# Patient Record
Sex: Male | Born: 2000 | Race: White | Hispanic: No | Marital: Single | Smoking: Never smoker
Health system: Southern US, Community
[De-identification: ages and names within clinical notes are randomized; demographics above are authoritative.]

---

## 2019-11-19 ENCOUNTER — Other Ambulatory Visit: Payer: Self-pay

## 2019-11-19 ENCOUNTER — Encounter: Payer: Self-pay | Admitting: Emergency Medicine

## 2019-11-19 ENCOUNTER — Ambulatory Visit
Admission: EM | Admit: 2019-11-19 | Discharge: 2019-11-19 | Disposition: A | Discharge status: Home / Self Care | Payer: Medicaid Other | Attending: Emergency Medicine | Admitting: Emergency Medicine

## 2019-11-19 DIAGNOSIS — R519 Headache, unspecified: Secondary | ICD-10-CM | POA: Diagnosis not present

## 2019-11-19 DIAGNOSIS — Z20828 Contact with and (suspected) exposure to other viral communicable diseases: Secondary | ICD-10-CM | POA: Diagnosis not present

## 2019-11-19 MED ORDER — NAPROXEN 500 MG PO TABS: 500.0000 mg | ORAL_TABLET | Freq: Two times a day (BID) | ORAL | 0 refills | Status: DC

## 2019-11-19 NOTE — ED Triage Notes (Signed)
Pt presents to [facility] for assessment for 2 hours of headache.  Mom is concerned for COVID, and patient wants to be tested.  Denies any other symptoms.

## 2019-11-19 NOTE — ED Notes (Signed)
Patient able to ambulate independently  

## 2019-11-19 NOTE — ED Provider Notes (Signed)
[facility]    CSN: [id number] Arrival date & time: 11/19/19  1951      History   Chief Complaint Chief Complaint  Patient presents with  . Headache    HPI Theodore Pierce is a 18 y.o. male   Presenting for Covid testing: Exposure: Coworker  Date of exposure: Tuesday: States coworker returned from [employer] that day Any fever, symptoms since exposure: Generalized headache without nausea, photophobia, phonophobia.  Patient tried Tylenol PTA without relief.   History reviewed. No pertinent past medical history.  There are no problems to display for this patient.   History reviewed. No pertinent surgical history.     Home Medications    Prior to Admission medications   Medication Sig Start Date End Date Taking? Authorizing Provider  naproxen (NAPROSYN) 500 MG tablet Take 1 tablet (500 mg total) by mouth 2 (two) times daily. 11/19/19   Hall-Potvin, [place], PA-C    Family History Family History  Problem Relation Age of Onset  . Diabetes Mother   . Healthy Father     Social History Social History   Tobacco Use  . Smoking status: Never Smoker  . Smokeless tobacco: Never Used  Substance Use Topics  . Alcohol use: Yes  . Drug use: Never     Allergies   Patient has no known allergies.   Review of Systems Review of Systems  Constitutional: Negative for fatigue and fever.  Respiratory: Negative for cough and shortness of breath.   Cardiovascular: Negative for chest pain and palpitations.  Gastrointestinal: Negative for abdominal pain, diarrhea and vomiting.  Musculoskeletal: Negative for arthralgias and myalgias.  Skin: Negative for rash and wound.  Neurological: Negative for speech difficulty and headaches.  All other systems reviewed and are negative.    Physical Exam Triage Vital Signs ED Triage Vitals  Enc Vitals Group     BP      Pulse      Resp      Temp      Temp src      SpO2      Weight      Height    Head Circumference      Peak Flow      Pain Score      Pain Loc      Pain Edu?      Excl. in GC?    No data found.  Updated Vital Signs BP 128/89 (BP Location: Left Arm)   Pulse 98   Temp 98.2 F (36.8 C) (Oral)   Resp 18   SpO2 98%   Visual Acuity Right Eye Distance:   Left Eye Distance:   Bilateral Distance:    Right Eye Near:   Left Eye Near:    Bilateral Near:     Physical Exam Constitutional:      General: He is not in acute distress.    Appearance: He is well-developed.  HENT:     Head: Normocephalic and atraumatic.     Mouth/Throat:     Mouth: Mucous membranes are moist.     Pharynx: Oropharynx is clear.  Eyes:     General: No scleral icterus.    Extraocular Movements: Extraocular movements intact.     Right eye: Normal extraocular motion and no nystagmus.     Left eye: Normal extraocular motion and no nystagmus.     Pupils: Pupils are equal, round, and reactive to light.  Cardiovascular:     Rate and Rhythm: Normal rate.  Pulmonary:     Effort: Pulmonary effort is normal. No respiratory distress.     Breath sounds: No wheezing.  Musculoskeletal:        General: No swelling. Normal range of motion.     Cervical back: Normal range of motion and neck supple. No rigidity.  Skin:    Capillary Refill: Capillary refill takes less than 2 seconds.     Coloration: Skin is not cyanotic, jaundiced or pale.     Findings: No rash.  Neurological:     Mental Status: He is alert and oriented to person, place, and time.     Cranial Nerves: No cranial nerve deficit, dysarthria or facial asymmetry.     Sensory: No sensory deficit.     Motor: No weakness.     Coordination: Coordination normal.     Gait: Gait normal.     Deep Tendon Reflexes: Reflexes normal.  Psychiatric:        Mood and Affect: Mood normal.        Speech: Speech normal.        Behavior: Behavior normal.      UC Treatments / Results  Labs (all labs ordered are listed, but only abnormal  results are displayed) Labs Reviewed  NOVEL CORONAVIRUS, NAA    EKG   Radiology No results found.  Procedures Procedures (including critical care time)  Medications Ordered in UC Medications - No data to display  Initial Impression / Assessment and Plan / UC Course  I have reviewed the triage vital signs and the nursing notes.  Pertinent labs & imaging results that were available during my care of the patient were reviewed by me and considered in my medical decision making (see chart for details).     Patient afebrile, nontoxic, with SpO2 98%.  No neurocognitive deficits on exam.  Covid PCR pending.  Patient to quarantine until results are back.  We will continue supportive management.  Try naproxen for headache.  Return precautions discussed, patient verbalized understanding and is agreeable to plan. Final Clinical Impressions(s) / UC Diagnoses   Final diagnoses:  Acute nonintractable headache, unspecified headache type     Discharge Instructions     Your COVID test is pending - it is important to quarantine / isolate at home until your results are back. If you test positive and would like further evaluation for persistent or worsening symptoms, you may schedule an E-visit or virtual (video) visit throughout the [facility] app or website.  PLEASE NOTE: If you develop severe chest pain or shortness of breath please go to the ER or call 9-1-1 for further evaluation --> DO NOT schedule electronic or virtual visits for this. Please call our office for further guidance / recommendations as needed.    ED Prescriptions    Medication Sig Dispense Auth. Provider   naproxen (NAPROSYN) 500 MG tablet Take 1 tablet (500 mg total) by mouth 2 (two) times daily. 30 tablet Hall-Potvin, [place], PA-C     PDMP not reviewed this encounter.   Hall-Potvin, [place], [place] 11/19/19 2025

## 2019-11-19 NOTE — Discharge Instructions (Addendum)
Your COVID test is pending - it is important to quarantine / isolate at home until your results are back. °If you test positive and would like further evaluation for persistent or worsening symptoms, you may schedule an E-visit or virtual (video) visit throughout the [facility] MyChart app or website. ° °PLEASE NOTE: If you develop severe chest pain or shortness of breath please go to the ER or call 9-1-1 for further evaluation --> DO NOT schedule electronic or virtual visits for this. °Please call our office for further guidance / recommendations as needed. °

## 2019-11-21 LAB — NOVEL CORONAVIRUS, NAA: SARS-CoV-2, NAA: NOT DETECTED

## 2019-12-02 ENCOUNTER — Encounter: Payer: Self-pay | Admitting: Emergency Medicine

## 2019-12-02 ENCOUNTER — Other Ambulatory Visit: Payer: Self-pay

## 2019-12-02 ENCOUNTER — Ambulatory Visit
Admission: EM | Admit: 2019-12-02 | Discharge: 2019-12-02 | Disposition: A | Discharge status: Home / Self Care | Payer: Medicaid Other | Attending: Emergency Medicine | Admitting: Emergency Medicine

## 2019-12-02 DIAGNOSIS — U071 COVID-19: Secondary | ICD-10-CM | POA: Diagnosis not present

## 2019-12-02 DIAGNOSIS — R509 Fever, unspecified: Secondary | ICD-10-CM

## 2019-12-02 LAB — POC SARS CORONAVIRUS 2 AG -  ED: SARS Coronavirus 2 Ag: POSITIVE — AB

## 2019-12-02 NOTE — Discharge Instructions (Signed)
It is very important to remember that since you have tested positive for Covid you need to be staying home and quarantining to help keep others safe and healthy in the community. ° °If you would like further evaluation for persistent or worsening symptoms, you may schedule an E-visit or virtual (video) visit throughout the [facility] MyChart app or website. ° °PLEASE NOTE: If you develop severe chest pain or shortness of breath please go to the ER or call 9-1-1 for further evaluation --> DO NOT schedule electronic or virtual visits for this. °Please call our office for further guidance / recommendations as needed. °

## 2019-12-02 NOTE — ED Provider Notes (Signed)
[facility]    CSN: [id number] Arrival date & time: 12/02/19  1258      History   Chief Complaint Chief Complaint  Patient presents with  . Fever    HPI Theodore Pierce is a 18 y.o. male presenting for Covid testing due to headache and fever of 101.4F this morning.  Patient states fever was alleviated with single dose of ibuprofen at 11 AM.  Patient denies cough, shortness of breath.  Of note, patient was seen by this provider on 12/10 for Covid testing with similar symptoms after work exposure 2 days before symptom onset: PCR test negative at that time.  Patient has not had symptoms in the interim.   History reviewed. No pertinent past medical history.  There are no problems to display for this patient.   History reviewed. No pertinent surgical history.     Home Medications    Prior to Admission medications   Not on File    Family History Family History  Problem Relation Age of Onset  . Diabetes Mother   . Healthy Father     Social History Social History   Tobacco Use  . Smoking status: Never Smoker  . Smokeless tobacco: Never Used  Substance Use Topics  . Alcohol use: Yes  . Drug use: Never     Allergies   Patient has no known allergies.   Review of Systems Review of Systems  Constitutional: Positive for fever. Negative for activity change, appetite change and fatigue.  Respiratory: Negative for cough and shortness of breath.   Cardiovascular: Negative for chest pain and palpitations.  Gastrointestinal: Negative for abdominal pain, diarrhea, nausea and vomiting.  Musculoskeletal: Negative for arthralgias and myalgias.  Skin: Negative for rash and wound.  Neurological: Positive for headaches. Negative for dizziness, facial asymmetry, speech difficulty and light-headedness.  All other systems reviewed and are negative.    Physical Exam Triage Vital Signs ED Triage Vitals  Enc Vitals Group     BP 12/02/19 1311 113/78   Pulse Rate 12/02/19 1311 (!) 125     Resp 12/02/19 1311 16     Temp 12/02/19 1311 99.2 F (37.3 C)     Temp Source 12/02/19 1311 Temporal     SpO2 12/02/19 1311 95 %     Weight --      Height --      Head Circumference --      Peak Flow --      Pain Score 12/02/19 1312 2     Pain Loc --      Pain Edu? --      Excl. in GC? --    No data found.  Updated Vital Signs BP 113/78 (BP Location: Left Arm)   Pulse (!) 125   Temp 99.2 F (37.3 C) (Temporal)   Resp 16   SpO2 95%   Visual Acuity Right Eye Distance:   Left Eye Distance:   Bilateral Distance:    Right Eye Near:   Left Eye Near:    Bilateral Near:     Physical Exam Constitutional:      General: He is not in acute distress.    Appearance: He is normal weight. He is not toxic-appearing.  HENT:     Head: Normocephalic and atraumatic.     Mouth/Throat:     Mouth: Mucous membranes are moist.     Pharynx: Oropharynx is clear.  Eyes:     General: No scleral icterus.    Pupils: Pupils  are equal, round, and reactive to light.  Cardiovascular:     Rate and Rhythm: Normal rate.     Comments: HR 109-114bpm at bedside Pulmonary:     Effort: Pulmonary effort is normal. No respiratory distress.     Breath sounds: No wheezing.  Skin:    Coloration: Skin is not jaundiced or pale.  Neurological:     Mental Status: He is alert and oriented to person, place, and time.      UC Treatments / Results  Labs (all labs ordered are listed, but only abnormal results are displayed) Labs Reviewed  POC SARS CORONAVIRUS 2 AG -  ED - Abnormal; Notable for the following components:      Result Value   SARS Coronavirus 2 Ag Positive (*)    All other components within normal limits    EKG   Radiology No results found.  Procedures Procedures (including critical care time)  Medications Ordered in UC Medications - No data to display  Initial Impression / Assessment and Plan / UC Course  I have reviewed the triage vital  signs and the nursing notes.  Pertinent labs & imaging results that were available during my care of the patient were reviewed by me and considered in my medical decision making (see chart for details).     Rapid Covid done in office given patient's fever, tachycardia: Positive.  Patient denying chest pain, appears well-hydrated: EKG deferred at this time.  Patient to begin quarantine.  We will continue supportive management.  Return precautions discussed, patient verbalized understanding and is agreeable to plan. Final Clinical Impressions(s) / UC Diagnoses   Final diagnoses:  Fever, unspecified  COVID-19 virus infection     Discharge Instructions     It is very important to remember that since you have tested positive for Covid you need to be staying home and quarantining to help keep others safe and healthy in the community.  If you would like further evaluation for persistent or worsening symptoms, you may schedule an E-visit or virtual (video) visit throughout the [facility] app or website.  PLEASE NOTE: If you develop severe chest pain or shortness of breath please go to the ER or call 9-1-1 for further evaluation --> DO NOT schedule electronic or virtual visits for this. Please call our office for further guidance / recommendations as needed.    ED Prescriptions    None     PDMP not reviewed this encounter.   Hall-Potvin, [place], [place] 12/02/19 1413

## 2019-12-02 NOTE — ED Triage Notes (Signed)
Pt presents to [facility] fro assessment after waking up this morning with headache and fever of 101.3.  Took ibuprofen around 11am.

## 2020-10-06 ENCOUNTER — Ambulatory Visit
Admission: EM | Admit: 2020-10-06 | Discharge: 2020-10-06 | Disposition: A | Discharge status: Home / Self Care | Payer: Medicaid Other | Attending: Emergency Medicine | Admitting: Emergency Medicine

## 2020-10-06 ENCOUNTER — Other Ambulatory Visit: Payer: Self-pay

## 2020-10-06 DIAGNOSIS — S29019A Strain of muscle and tendon of unspecified wall of thorax, initial encounter: Secondary | ICD-10-CM | POA: Diagnosis not present

## 2020-10-06 MED ORDER — CYCLOBENZAPRINE HCL 5 MG PO TABS: 5.0000 mg | ORAL_TABLET | Freq: Two times a day (BID) | ORAL | 0 refills | Status: AC | PRN

## 2020-10-06 MED ORDER — NAPROXEN 500 MG PO TABS: 500.0000 mg | ORAL_TABLET | Freq: Two times a day (BID) | ORAL | 0 refills | Status: DC

## 2020-10-06 NOTE — Discharge Instructions (Addendum)

## 2020-10-06 NOTE — ED Triage Notes (Signed)
Pt c/o rt mid back pain x2 days after trying to throw a bag of ice across his shoulder. States pain worse on movement.

## 2020-10-06 NOTE — ED Provider Notes (Signed)
[facility]    CSN: [id number] Arrival date & time: 10/06/20  1101      History   Chief Complaint Chief Complaint  Patient presents with  . Back Pain    HPI Theodore Pierce is a 19 y.o. male  presents for right mid back pain x 2 days.  States pain is nonradiating, worse w/ lifting, positional.  Has tried nothing for relief.  Denies trauma/injury to the affected area, though states this occurred after "throwing ice over my shoulder".  Denies fever, saddle area anesthesia, lower extremity numbness/weakness, urinary retention, fecal incontinence.  History reviewed. No pertinent past medical history.  There are no problems to display for this patient.   History reviewed. No pertinent surgical history.     Home Medications    Prior to Admission medications   Medication Sig Start Date End Date Taking? Authorizing Provider  cyclobenzaprine (FLEXERIL) 5 MG tablet Take 1 tablet (5 mg total) by mouth 2 (two) times daily as needed for up to 7 days for muscle spasms. 10/06/20 10/13/20  Hall-Potvin, [place], PA-C  naproxen (NAPROSYN) 500 MG tablet Take 1 tablet (500 mg total) by mouth 2 (two) times daily. 10/06/20   Hall-Potvin, [place], PA-C    Family History Family History  Problem Relation Age of Onset  . Diabetes Mother   . Healthy Father     Social History Social History   Tobacco Use  . Smoking status: Never Smoker  . Smokeless tobacco: Never Used  Substance Use Topics  . Alcohol use: Yes  . Drug use: Never     Allergies   Patient has no known allergies.   Review of Systems Review of Systems  Constitutional: Negative for fatigue and fever.  Respiratory: Negative for cough and shortness of breath.   Cardiovascular: Negative for chest pain and palpitations.  Gastrointestinal: Negative for abdominal pain, diarrhea and vomiting.  Musculoskeletal: Positive for back pain. Negative for arthralgias, gait problem, myalgias, neck pain and neck  stiffness.  Skin: Negative for rash and wound.  Neurological: Negative for speech difficulty, weakness and headaches.  All other systems reviewed and are negative.    Physical Exam Triage Vital Signs ED Triage Vitals [10/06/20 1116]  Enc Vitals Group     BP 135/84     Pulse Rate 98     Resp 18     Temp 98.4 F (36.9 C)     Temp Source Oral     SpO2 96 %     Weight      Height      Head Circumference      Peak Flow      Pain Score 8     Pain Loc      Pain Edu?      Excl. in GC?    No data found.  Updated Vital Signs BP 135/84 (BP Location: Left Arm)   Pulse 98   Temp 98.4 F (36.9 C) (Oral)   Resp 18   SpO2 96%   Visual Acuity Right Eye Distance:   Left Eye Distance:   Bilateral Distance:    Right Eye Near:   Left Eye Near:    Bilateral Near:     Physical Exam Constitutional:      General: He is not in acute distress. HENT:     Head: Normocephalic and atraumatic.  Eyes:     General: No scleral icterus.    Pupils: Pupils are equal, round, and reactive to light.  Cardiovascular:  Rate and Rhythm: Normal rate.  Pulmonary:     Effort: Pulmonary effort is normal. No respiratory distress.     Breath sounds: No wheezing.  Musculoskeletal:        General: Tenderness present. No swelling. Normal range of motion.       Arms:     Cervical back: Normal range of motion and neck supple. No rigidity or tenderness.  Skin:    Capillary Refill: Capillary refill takes less than 2 seconds.     Coloration: Skin is not jaundiced or pale.  Neurological:     General: No focal deficit present.     Mental Status: He is alert and oriented to person, place, and time.      UC Treatments / Results  Labs (all labs ordered are listed, but only abnormal results are displayed) Labs Reviewed - No data to display  EKG   Radiology No results found.  Procedures Procedures (including critical care time)  Medications Ordered in UC Medications - No data to  display  Initial Impression / Assessment and Plan / UC Course  I have reviewed the triage vital signs and the nursing notes.  Pertinent labs & imaging results that were available during my care of the patient were reviewed by me and considered in my medical decision making (see chart for details).     Will treat for likely strain as below.  Return precautions discussed, pt verbalized understanding and is agreeable to plan. Final Clinical Impressions(s) / UC Diagnoses   Final diagnoses:  Strain of thoracic region, initial encounter     Discharge Instructions     Heat therapy (hot compress, warm wash rag, hot showers, etc.) can help relax muscles and soothe muscle aches. Cold therapy (ice packs) can be used to help swelling both after injury and after prolonged use of areas of chronic pain/aches.  Pain medication:  500 mg Naprosyn/Aleve (naproxen) every 12 hours with food:  AVOID other NSAIDs while taking this (may have Tylenol).  May take muscle relaxer as needed for severe pain / spasm.  (This medication may cause you to become tired so it is important you do not drink alcohol or operate heavy machinery while on this medication.  Recommend your first dose to be taken before bedtime to monitor for side effects safely)  Important to follow up with specialist(s) below for further evaluation/management if your symptoms persist or worsen.    ED Prescriptions    Medication Sig Dispense Auth. Provider   naproxen (NAPROSYN) 500 MG tablet Take 1 tablet (500 mg total) by mouth 2 (two) times daily. 30 tablet Hall-Potvin, [place], PA-C   cyclobenzaprine (FLEXERIL) 5 MG tablet Take 1 tablet (5 mg total) by mouth 2 (two) times daily as needed for up to 7 days for muscle spasms. 14 tablet Hall-Potvin, [place], PA-C     I have reviewed the PDMP during this encounter.   Hall-Potvin, [place], [place] 10/06/20 1230

## 2021-04-05 ENCOUNTER — Ambulatory Visit
Admission: EM | Admit: 2021-04-05 | Discharge: 2021-04-05 | Disposition: A | Discharge status: Home / Self Care | Payer: Medicaid Other | Attending: Internal Medicine | Admitting: Internal Medicine

## 2021-04-05 ENCOUNTER — Encounter: Payer: Self-pay | Admitting: Emergency Medicine

## 2021-04-05 ENCOUNTER — Other Ambulatory Visit: Payer: Self-pay

## 2021-04-05 ENCOUNTER — Ambulatory Visit (INDEPENDENT_AMBULATORY_CARE_PROVIDER_SITE_OTHER): Payer: Medicaid Other

## 2021-04-05 DIAGNOSIS — R509 Fever, unspecified: Secondary | ICD-10-CM

## 2021-04-05 DIAGNOSIS — B349 Viral infection, unspecified: Secondary | ICD-10-CM | POA: Diagnosis not present

## 2021-04-05 DIAGNOSIS — R059 Cough, unspecified: Secondary | ICD-10-CM | POA: Diagnosis not present

## 2021-04-05 MED ORDER — ACETAMINOPHEN 325 MG PO TABS
650.0000 mg | ORAL_TABLET | Freq: Once | ORAL | Status: AC
  Administered 2021-04-05: 650 mg via ORAL

## 2021-04-05 MED ORDER — BENZONATATE 100 MG PO CAPS: 100.0000 mg | ORAL_CAPSULE | Freq: Three times a day (TID) | ORAL | 0 refills | Status: AC

## 2021-04-05 MED ORDER — SODIUM CHLORIDE 0.9 % IV BOLUS: 1000.0000 mL | Freq: Once | INTRAVENOUS | Status: DC

## 2021-04-05 NOTE — Discharge Instructions (Addendum)
Please go to the ED if your symptoms worsen. If you experience worsening shortness of breath, chest or chest pressure, feeling faint-please go to the emergency department to be evaluated further. We will call you with lab results and abnormal Increase oral fluid intake

## 2021-04-05 NOTE — ED Triage Notes (Signed)
Pt here for fever and body aches with cough starting last night

## 2021-04-05 NOTE — ED Notes (Signed)
Pt declined all blood work and fluids; PL notified

## 2021-04-05 NOTE — ED Provider Notes (Signed)
[facility]    CSN: [id number] Arrival date & time: 04/05/21  1207      History   Chief Complaint Chief Complaint  Patient presents with  . Fever  . Cough    HPI Theodore Pierce is a 20 y.o. male comes to the urgent care with complaints of a fever of 101.2 Fahrenheit, generalized body aches, cough and some sore throat which started last night.  Patient's symptom onset was sudden and has been persistent.  Cough is productive of greenish sputum.  He denies any shortness of breath or wheezing.  No sick exposures.  Patient has not been vaccinated against COVID-19 virus or influenza.  No nausea, vomiting or diarrhea.  No abdominal pain.  Patient has some dizziness but denies any syncopal or near syncopal episodes. HPI  History reviewed. No pertinent past medical history.  There are no problems to display for this patient.   History reviewed. No pertinent surgical history.     Home Medications    Prior to Admission medications   Medication Sig Start Date End Date Taking? Authorizing Provider  benzonatate (TESSALON) 100 MG capsule Take 1 capsule (100 mg total) by mouth every 8 (eight) hours. 04/05/21  Yes Braylon Grenda, Britta Mccreedy, MD  naproxen (NAPROSYN) 500 MG tablet Take 1 tablet (500 mg total) by mouth 2 (two) times daily. 10/06/20   Hall-Potvin, [place], PA-C    Family History Family History  Problem Relation Age of Onset  . Diabetes Mother   . Healthy Father     Social History Social History   Tobacco Use  . Smoking status: Never Smoker  . Smokeless tobacco: Never Used  Substance Use Topics  . Alcohol use: Yes  . Drug use: Never     Allergies   Patient has no known allergies.   Review of Systems Review of Systems  Constitutional: Positive for chills and fever. Negative for activity change.  HENT: Positive for congestion.   Eyes: Negative.   Respiratory: Positive for cough and shortness of breath. Negative for wheezing.   Cardiovascular:  Negative.  Negative for chest pain.  Gastrointestinal: Negative.   Neurological: Positive for dizziness and light-headedness. Negative for headaches.     Physical Exam Triage Vital Signs ED Triage Vitals  Enc Vitals Group     BP 04/05/21 1414 (!) 104/58     Pulse Rate 04/05/21 1414 (!) 122     Resp 04/05/21 1414 18     Temp 04/05/21 1414 (!) 101.2 F (38.4 C)     Temp Source 04/05/21 1414 Oral     SpO2 04/05/21 1414 96 %     Weight --      Height --      Head Circumference --      Peak Flow --      Pain Score 04/05/21 1415 7     Pain Loc --      Pain Edu? --      Excl. in GC? --    No data found.  Updated Vital Signs BP (!) 104/58 (BP Location: Left Arm)   Pulse (!) 122   Temp (!) 101.2 F (38.4 C) (Oral)   Resp 18   SpO2 96%   Visual Acuity Right Eye Distance:   Left Eye Distance:   Bilateral Distance:    Right Eye Near:   Left Eye Near:    Bilateral Near:     Physical Exam Vitals and nursing note reviewed.  Constitutional:  Appearance: Normal appearance. He is ill-appearing.  HENT:     Right Ear: Tympanic membrane normal.     Left Ear: Tympanic membrane normal.     Mouth/Throat:     Mouth: Mucous membranes are moist.     Pharynx: No oropharyngeal exudate or posterior oropharyngeal erythema.  Eyes:     Extraocular Movements: Extraocular movements intact.     Conjunctiva/sclera: Conjunctivae normal.  Cardiovascular:     Rate and Rhythm: Normal rate and regular rhythm.     Pulses: Normal pulses.     Heart sounds: Normal heart sounds.  Pulmonary:     Effort: Pulmonary effort is normal. No respiratory distress.     Breath sounds: Normal breath sounds. No stridor. No wheezing or rhonchi.  Abdominal:     General: Bowel sounds are normal.     Palpations: Abdomen is soft.  Musculoskeletal:        General: Normal range of motion.  Neurological:     Mental Status: He is alert.      UC Treatments / Results  Labs (all labs ordered are listed, but  only abnormal results are displayed) Labs Reviewed  COVID-19, FLU A+B NAA  CBC WITH DIFFERENTIAL/PLATELET  COMPREHENSIVE METABOLIC PANEL    EKG   Radiology DG Chest 2 View  Result Date: 04/05/2021 CLINICAL DATA:  Cough and fever EXAM: CHEST - 2 VIEW COMPARISON:  None. FINDINGS: The heart size and mediastinal contours are within normal limits. Both lungs are clear. The visualized skeletal structures are unremarkable. IMPRESSION: No active cardiopulmonary disease. Electronically Signed   By: Maudry Mayhew MD   On: 04/05/2021 15:03    Procedures Procedures (including critical care time)  Medications Ordered in UC Medications  sodium chloride 0.9 % bolus 1,000 mL (has no administration in time range)  acetaminophen (TYLENOL) tablet 650 mg (650 mg Oral Given 04/05/21 1420)    Initial Impression / Assessment and Plan / UC Course  I have reviewed the triage vital signs and the nursing notes.  Pertinent labs & imaging results that were available during my care of the patient were reviewed by me and considered in my medical decision making (see chart for details).     1.  Acute viral syndrome: Chest x-ray is negative for acute lung infiltrate I offered normal saline bolus and blood work but the patient refused IV fluids and the blood draws.  The risks of not having further work-up and receiving IV fluids was discussed with the patient.  He is adamant about not getting any IV fluids or blood draws.Patient is advised to increase oral fluids.We advised patient to go to the emergency department if symptoms worsen.   Final Clinical Impressions(s) / UC Diagnoses   Final diagnoses:  Fever, unspecified  Acute viral syndrome     Discharge Instructions     Please go to the ED if your symptoms worsen. If you experience worsening shortness of breath, chest or chest pressure, feeling faint-please go to the emergency department to be evaluated further. We will call you with lab results and  abnormal Increase oral fluid intake   ED Prescriptions    Medication Sig Dispense Auth. Provider   benzonatate (TESSALON) 100 MG capsule Take 1 capsule (100 mg total) by mouth every 8 (eight) hours. 21 capsule Treysean Petruzzi, Britta Mccreedy, MD     PDMP not reviewed this encounter.   Merrilee Jansky, MD 04/05/21 [phone]

## 2021-04-06 LAB — COVID-19, FLU A+B NAA
Influenza A, NAA: NOT DETECTED
Influenza B, NAA: NOT DETECTED
SARS-CoV-2, NAA: NOT DETECTED

## 2021-04-15 ENCOUNTER — Encounter: Payer: Self-pay | Admitting: *Deleted

## 2021-04-15 ENCOUNTER — Ambulatory Visit
Admission: EM | Admit: 2021-04-15 | Discharge: 2021-04-15 | Disposition: A | Discharge status: Home / Self Care | Payer: Medicaid Other | Attending: Family Medicine | Admitting: Family Medicine

## 2021-04-15 ENCOUNTER — Other Ambulatory Visit: Payer: Self-pay

## 2021-04-15 DIAGNOSIS — R062 Wheezing: Secondary | ICD-10-CM

## 2021-04-15 DIAGNOSIS — R053 Chronic cough: Secondary | ICD-10-CM | POA: Diagnosis not present

## 2021-04-15 MED ORDER — AZITHROMYCIN 250 MG PO TABS: 250.0000 mg | ORAL_TABLET | Freq: Every day | ORAL | 0 refills | Status: AC

## 2021-04-15 MED ORDER — PREDNISONE 20 MG PO TABS: 40.0000 mg | ORAL_TABLET | Freq: Every day | ORAL | 0 refills | Status: AC

## 2021-04-15 MED ORDER — HYDROCOD POLST-CPM POLST ER 10-8 MG/5ML PO SUER: 5.0000 mL | Freq: Every evening | ORAL | 0 refills | Status: DC | PRN

## 2021-04-15 NOTE — ED Triage Notes (Signed)
Pt was seen 4/27 for fever and cough; states no longer has fevers and has been taking Rx cough med, but continues with significant cough and runny nose.

## 2021-04-15 NOTE — Discharge Instructions (Signed)
Be aware, your cough medication may cause drowsiness. Please do not drive, operate heavy machinery or make important decisions while on this medication, it can cloud your judgement.  

## 2021-04-15 NOTE — ED Provider Notes (Signed)
[facility]   [id number] 04/15/21 Arrival Time: 0808  ASSESSMENT & PLAN:  1. Persistent dry cough   2. Wheezing    No indication for re-imaging at this time. Work note provided.  Meds ordered this encounter  Medications  . azithromycin (ZITHROMAX) 250 MG tablet    Sig: Take 1 tablet (250 mg total) by mouth daily. Take first 2 tablets together, then 1 every day until finished.    Dispense:  6 tablet    Refill:  0  . predniSONE (DELTASONE) 20 MG tablet    Sig: Take 2 tablets (40 mg total) by mouth daily.    Dispense:  10 tablet    Refill:  0  . chlorpheniramine-HYDROcodone (TUSSIONEX PENNKINETIC ER) 10-8 MG/5ML SUER    Sig: Take 5 mLs by mouth at bedtime as needed for cough.    Dispense:  90 mL    Refill:  0    Discharge Instructions     Be aware, your cough medication may cause drowsiness. Please do not drive, operate heavy machinery or make important decisions while on this medication, it can cloud your judgement.  OTC symptom care as needed. Ensure adequate fluid intake and rest.   Follow-up Information    [facility] .   Specialty: Urgent Care Why: If worsening or failing to improve as anticipated. Contact information: [address] [id number] mc [place] [address] [phone]               Reviewed expectations re: course of current medical issues. Questions answered. Outlined signs and symptoms indicating need for more acute intervention. Patient verbalized understanding. After Visit Summary given.   SUBJECTIVE: History from: patient. Seen here 04/05/21; note reviewed by me. [place] comes into day reporting persistent, significant dry cough for the past 2.5, maybe 3 w. Prev Rx cough med doesn't help. I have personally viewed the imaging studies ordered last visit; normal chest. Feels overall better but cough is persistent and limiting sleep and limiting his performance at work. No  fevers or SOB. Questions wheezing at times. COVID and flu negative last visit. No specific aggravating or alleviating factors reported. Normal PO intake without n/v/d.  Social History   Tobacco Use  Smoking Status Never Smoker  Smokeless Tobacco Never Used      OBJECTIVE:  BP (!) 142/88 (BP Location: Left Arm)   Pulse 90   Temp 98.5 F (36.9 C) (Oral)   Resp 20   SpO2 97%   Recheck RR: 18 General appearance: alert; appears fatigued HEENT: nasal congestion; clear runny nose; throat irritation secondary to post-nasal drainage Neck: supple without LAD CV: RRR Lungs: unlabored respirations, symmetrical air entry with mild bilateral exp wheezing; cough: marked and dry Abd: soft Ext: no LE edema Skin: warm and dry Psychological: alert and cooperative; normal mood and affect No results found.  No Known Allergies  History reviewed. No pertinent past medical history.   Family History  Problem Relation Age of Onset  . Diabetes Mother    Social History   Socioeconomic History  . Marital status: Single    Spouse name: Not on file  . Number of children: Not on file  . Years of education: Not on file  . Highest education level: Not on file  Occupational History  . Not on file  Tobacco Use  . Smoking status: Never Smoker  . Smokeless tobacco: Never Used  Vaping Use  . Vaping Use: Never used  Substance  and Sexual Activity  . Alcohol use: Not Currently  . Drug use: Never  . Sexual activity: Not on file  Other Topics Concern  . Not on file  Social History Narrative  . Not on file   Social Determinants of Health   Financial Resource Strain: Not on file  Food Insecurity: Not on file  Transportation Needs: Not on file  Physical Activity: Not on file  Stress: Not on file  Social Connections: Not on file  Intimate Partner Violence: Not on file           Mardella Layman, MD 04/15/21 [phone]

## 2021-05-02 ENCOUNTER — Encounter (HOSPITAL_COMMUNITY): Payer: Self-pay | Admitting: Student

## 2021-05-02 ENCOUNTER — Emergency Department (HOSPITAL_COMMUNITY)
Admission: EM | Admit: 2021-05-02 | Discharge: 2021-05-02 | Disposition: A | Discharge status: Home / Self Care | Payer: Medicaid Other | Attending: Emergency Medicine | Admitting: Emergency Medicine

## 2021-05-02 ENCOUNTER — Emergency Department (HOSPITAL_COMMUNITY): Payer: Medicaid Other

## 2021-05-02 DIAGNOSIS — Z28310 Unvaccinated for covid-19: Secondary | ICD-10-CM | POA: Diagnosis not present

## 2021-05-02 DIAGNOSIS — R059 Cough, unspecified: Secondary | ICD-10-CM | POA: Diagnosis present

## 2021-05-02 DIAGNOSIS — R000 Tachycardia, unspecified: Secondary | ICD-10-CM | POA: Insufficient documentation

## 2021-05-02 DIAGNOSIS — U071 COVID-19: Secondary | ICD-10-CM | POA: Insufficient documentation

## 2021-05-02 DIAGNOSIS — D72829 Elevated white blood cell count, unspecified: Secondary | ICD-10-CM | POA: Insufficient documentation

## 2021-05-02 LAB — COMPREHENSIVE METABOLIC PANEL
ALT: 22 U/L (ref 0–44)
AST: 16 U/L (ref 15–41)
Albumin: 4.4 g/dL (ref 3.5–5.0)
Alkaline Phosphatase: 71 U/L (ref 38–126)
Anion gap: 9 (ref 5–15)
BUN: 7 mg/dL (ref 6–20)
CO2: 26 mmol/L (ref 22–32)
Calcium: 9.7 mg/dL (ref 8.9–10.3)
Chloride: 105 mmol/L (ref 98–111)
Creatinine, Ser: 0.98 mg/dL (ref 0.61–1.24)
GFR, Estimated: >60 mL/min (ref >60)
Glucose, Bld: 90 mg/dL (ref 70–99)
Potassium: 3.8 mmol/L (ref 3.5–5.1)
Sodium: 140 mmol/L (ref 135–145)
Total Bilirubin: 0.7 mg/dL (ref 0.3–1.2)
Total Protein: 8.2 g/dL — ABNORMAL HIGH (ref 6.5–8.1)

## 2021-05-02 LAB — CBC WITH DIFFERENTIAL/PLATELET
Abs Immature Granulocytes: 0.03 10*3/uL (ref 0.00–0.07)
Basophils Absolute: 0 10*3/uL (ref 0.0–0.1)
Basophils Relative: 0 %
Eosinophils Absolute: 0.1 10*3/uL (ref 0.0–0.5)
Eosinophils Relative: 1 %
HCT: 47.8 % (ref 39.0–52.0)
Hemoglobin: 16 g/dL (ref 13.0–17.0)
Immature Granulocytes: 0 %
Lymphocytes Relative: 10 %
Lymphs Abs: 1.5 10*3/uL (ref 0.7–4.0)
MCH: 30.1 pg (ref 26.0–34.0)
MCHC: 33.5 g/dL (ref 30.0–36.0)
MCV: 89.8 fL (ref 80.0–100.0)
Monocytes Absolute: 0.8 10*3/uL (ref 0.1–1.0)
Monocytes Relative: 5 %
Neutro Abs: 12.1 10*3/uL — ABNORMAL HIGH (ref 1.7–7.7)
Neutrophils Relative %: 84 %
Platelets: 298 10*3/uL (ref 150–400)
RBC: 5.32 MIL/uL (ref 4.22–5.81)
RDW: 12.6 % (ref 11.5–15.5)
WBC: 14.6 10*3/uL — ABNORMAL HIGH (ref 4.0–10.5)
nRBC: 0 % (ref 0.0–0.2)

## 2021-05-02 LAB — RESP PANEL BY RT-PCR (FLU A&B, COVID) ARPGX2
Influenza A by PCR: NEGATIVE
Influenza B by PCR: NEGATIVE
SARS Coronavirus 2 by RT PCR: POSITIVE — AB

## 2021-05-02 MED ORDER — ACETAMINOPHEN 325 MG PO TABS
650.0000 mg | ORAL_TABLET | Freq: Once | ORAL | Status: AC
  Administered 2021-05-02: 650 mg via ORAL
  Filled 2021-05-02: qty 2

## 2021-05-02 NOTE — Discharge Instructions (Addendum)
You have tested positive for COVID-19 which we suspect is the cause of your symptoms.   We are instructing patient's with COVID 19 or symptoms of COVID 19 to follow the below instructions regardless of vaccination status:  - Stay home and isolate for days 1 through 5, day 0 is your first day of symptom onset. - If you have no symptoms or your symptoms are resolving after 5 days, on day 6, you can leave your house--> Continue to wear a mask around others for 5 additional days. - If you have a fever, continue to stay home until your fever resolves.   Take Tylenol per over-the-counter dosing to help with fever/discomfort.  Please be sure to stay well-hydrated.  Drink plenty of electrolyte containing solutions.  Please follow up with primary care within 3-5 days for re-evaluation- call prior to going to the office to make them aware of your symptoms as some offices are altering their method of seeing patients with COVID 19 symptoms, we have also provided our [place] covid clinic for follow up as well.  Return to the ER for new or worsening symptoms including but not limited to increased work of breathing, chest pain, passing out, or any other concerns.       Person Under Monitoring Name: Theodore Pierce  Location: [address] [place] [address]   Infection Prevention Recommendations for Individuals Confirmed to have, or Being Evaluated for, 2019 Novel Coronavirus (COVID-19) Infection Who Receive Care at Home  Individuals who are confirmed to have, or are being evaluated for, COVID-19 should follow the prevention steps below until a healthcare provider or local or state health department says they can return to normal activities.  Stay home except to get medical care You should restrict activities outside your home, except for getting medical care. Do not go to work, school, or public areas, and do not use public transportation or taxis.  Call ahead before visiting  your doctor Before your medical appointment, call the healthcare provider and tell them that you have, or are being evaluated for, COVID-19 infection. This will help the healthcare provider's office take steps to keep other people from getting infected. Ask your healthcare provider to call the local or state health department.  Monitor your symptoms Seek prompt medical attention if your illness is worsening (e.g., difficulty breathing). Before going to your medical appointment, call the healthcare provider and tell them that you have, or are being evaluated for, COVID-19 infection. Ask your healthcare provider to call the local or state health department.  Wear a facemask You should wear a facemask that covers your nose and mouth when you are in the same room with other people and when you visit a healthcare provider. People who live with or visit you should also wear a facemask while they are in the same room with you.  Separate yourself from other people in your home As much as possible, you should stay in a different room from other people in your home. Also, you should use a separate bathroom, if available.  Avoid sharing household items You should not share dishes, drinking glasses, cups, eating utensils, towels, bedding, or other items with other people in your home. After using these items, you should wash them thoroughly with soap and water.  Cover your coughs and sneezes Cover your mouth and nose with a tissue when you cough or sneeze, or you can cough or sneeze into your sleeve. Throw used tissues in a lined  trash can, and immediately wash your hands with soap and water for at least 20 seconds or use an alcohol-based hand rub.  Wash your [employer] your hands often and thoroughly with soap and water for at least 20 seconds. You can use an alcohol-based hand sanitizer if soap and water are not available and if your hands are not visibly dirty. Avoid touching your eyes, nose,  and mouth with unwashed hands.   Prevention Steps for Caregivers and Household Members of Individuals Confirmed to have, or Being Evaluated for, COVID-19 Infection Being Cared for in the Home  If you live with, or provide care at home for, a person confirmed to have, or being evaluated for, COVID-19 infection please follow these guidelines to prevent infection:  Follow healthcare provider's instructions Make sure that you understand and can help the patient follow any healthcare provider instructions for all care.  Provide for the patient's basic needs You should help the patient with basic needs in the home and provide support for getting groceries, prescriptions, and other personal needs.  Monitor the patient's symptoms If they are getting sicker, call his or her medical provider and tell them that the patient has, or is being evaluated for, COVID-19 infection. This will help the healthcare provider's office take steps to keep other people from getting infected. Ask the healthcare provider to call the local or state health department.  Limit the number of people who have contact with the patient If possible, have only one caregiver for the patient. Other household members should stay in another home or place of residence. If this is not possible, they should stay in another room, or be separated from the patient as much as possible. Use a separate bathroom, if available. Restrict visitors who do not have an essential need to be in the home.  Keep older adults, very young children, and other sick people away from the patient Keep older adults, very young children, and those who have compromised immune systems or chronic health conditions away from the patient. This includes people with chronic heart, lung, or kidney conditions, diabetes, and cancer.  Ensure good ventilation Make sure that shared spaces in the home have good air flow, such as from an air conditioner or an opened  window, weather permitting.  Wash your hands often Wash your hands often and thoroughly with soap and water for at least 20 seconds. You can use an alcohol based hand sanitizer if soap and water are not available and if your hands are not visibly dirty. Avoid touching your eyes, nose, and mouth with unwashed hands. Use disposable paper towels to dry your hands. If not available, use dedicated cloth towels and replace them when they become wet.  Wear a facemask and gloves Wear a disposable facemask at all times in the room and gloves when you touch or have contact with the patient's blood, body fluids, and/or secretions or excretions, such as sweat, saliva, sputum, nasal mucus, vomit, urine, or feces.  Ensure the mask fits over your nose and mouth tightly, and do not touch it during use. Throw out disposable facemasks and gloves after using them. Do not reuse. Wash your hands immediately after removing your facemask and gloves. If your personal clothing becomes contaminated, carefully remove clothing and launder. Wash your hands after handling contaminated clothing. Place all used disposable facemasks, gloves, and other waste in a lined container before disposing them with other household waste. Remove gloves and wash your hands immediately after handling these items.  Do not share dishes, glasses, or other household items with the patient Avoid sharing household items. You should not share dishes, drinking glasses, cups, eating utensils, towels, bedding, or other items with a patient who is confirmed to have, or being evaluated for, COVID-19 infection. After the person uses these items, you should wash them thoroughly with soap and water.  Wash laundry thoroughly Immediately remove and wash clothes or bedding that have blood, body fluids, and/or secretions or excretions, such as sweat, saliva, sputum, nasal mucus, vomit, urine, or feces, on them. Wear gloves when handling laundry from the  patient. Read and follow directions on labels of laundry or clothing items and detergent. In general, wash and dry with the warmest temperatures recommended on the label.  Clean all areas the individual has used often Clean all touchable surfaces, such as counters, tabletops, doorknobs, bathroom fixtures, toilets, phones, keyboards, tablets, and bedside tables, every day. Also, clean any surfaces that may have blood, body fluids, and/or secretions or excretions on them. Wear gloves when cleaning surfaces the patient has come in contact with. Use a diluted bleach solution (e.g., dilute bleach with 1 part bleach and 10 parts water) or a household disinfectant with a label that says EPA-registered for coronaviruses. To make a bleach solution at home, add 1 tablespoon of bleach to 1 quart (4 cups) of water. For a larger supply, add  cup of bleach to 1 gallon (16 cups) of water. Read labels of cleaning products and follow recommendations provided on product labels. Labels contain instructions for safe and effective use of the cleaning product including precautions you should take when applying the product, such as wearing gloves or eye protection and making sure you have good ventilation during use of the product. Remove gloves and wash hands immediately after cleaning.  Monitor yourself for signs and symptoms of illness Caregivers and household members are considered close contacts, should monitor their health, and will be asked to limit movement outside of the home to the extent possible. Follow the monitoring steps for close contacts listed on the symptom monitoring form.   ? If you have additional questions, contact your local health department or call the epidemiologist on call at [phone] (available 24/7). ? This guidance is subject to change. For the most up-to-date guidance from [facility], please refer to their website: [email or web address]hu

## 2021-05-02 NOTE — ED Provider Notes (Signed)
[facility]-EMERGENCY DEPT Provider Note   CSN: [id number] Arrival date & time: 05/02/21  0012     History Chief Complaint  Patient presents with  . Cough    Theodore Pierce is a 20 y.o. male without significant past medical hx who presents to the ED with complaints of cough x 2 days. Patient reports dry cough, fever, chills, and some mild achiness for 2 days now. No alleviating/aggravaitng factors. No intervention PTA. Has had somewhat similar over the past 1 month, received abx & steroids with negative covid swab and did get better until 2 days ago when sxs started. He denies chest pain, dyspnea, vomiting, diarrhea, hemoptysis, leg pain/swelling, or syncope. Not vaccinated against covid 19.   HPI     No past medical history on file.  There are no problems to display for this patient.   No past surgical history on file.     Family History  Problem Relation Age of Onset  . Diabetes Mother     Social History   Tobacco Use  . Smoking status: Never Smoker  . Smokeless tobacco: Never Used  Vaping Use  . Vaping Use: Never used  Substance Use Topics  . Alcohol use: Not Currently  . Drug use: Never    Home Medications Prior to Admission medications   Medication Sig Start Date End Date Taking? Authorizing Provider  azithromycin (ZITHROMAX) 250 MG tablet Take 1 tablet (250 mg total) by mouth daily. Take first 2 tablets together, then 1 every day until finished. 04/15/21   Mardella Layman, MD  benzonatate (TESSALON) 100 MG capsule Take 1 capsule (100 mg total) by mouth every 8 (eight) hours. 04/05/21   Merrilee Jansky, MD  chlorpheniramine-HYDROcodone (TUSSIONEX PENNKINETIC ER) 10-8 MG/5ML SUER Take 5 mLs by mouth at bedtime as needed for cough. 04/15/21   Mardella Layman, MD  naproxen (NAPROSYN) 500 MG tablet Take 1 tablet (500 mg total) by mouth 2 (two) times daily. 10/06/20   Hall-Potvin, [place], PA-C  predniSONE (DELTASONE) 20 MG tablet Take 2 tablets  (40 mg total) by mouth daily. 04/15/21   Mardella Layman, MD    Allergies    Patient has no known allergies.  Review of Systems   Review of Systems  Constitutional: Positive for chills and fever.  HENT: Negative for congestion, ear pain and sore throat.   Respiratory: Positive for cough. Negative for shortness of breath.   Cardiovascular: Negative for chest pain and leg swelling.  Gastrointestinal: Negative for abdominal pain, diarrhea, nausea and vomiting.  Neurological: Negative for syncope.  All other systems reviewed and are negative.   Physical Exam Updated Vital Signs BP 132/87   Pulse (!) 108   Temp 99.5 F (37.5 C) (Oral)   Resp 20   Ht 5\' 6"  (1.676 m)   Wt 81.6 kg   SpO2 98%   BMI 29.05 kg/m   Physical Exam Vitals and nursing note reviewed.  Constitutional:      General: He is not in acute distress.    Appearance: He is well-developed. He is not toxic-appearing.  HENT:     Head: Normocephalic and atraumatic.  Eyes:     General:        Right eye: No discharge.        Left eye: No discharge.     Conjunctiva/sclera: Conjunctivae normal.  Cardiovascular:     Rate and Rhythm: Regular rhythm. Tachycardia present.  Pulmonary:     Effort: Pulmonary effort is normal. No  respiratory distress.     Breath sounds: Normal breath sounds. No wheezing, rhonchi or rales.  Abdominal:     General: There is no distension.     Palpations: Abdomen is soft.     Tenderness: There is no abdominal tenderness.  Musculoskeletal:     Cervical back: Neck supple. No rigidity.  Skin:    General: Skin is warm and dry.     Findings: No rash.  Neurological:     Mental Status: He is alert.     Comments: Clear speech.   Psychiatric:        Behavior: Behavior normal.     ED Results / Procedures / Treatments   Labs (all labs ordered are listed, but only abnormal results are displayed) Labs Reviewed  RESP PANEL BY RT-PCR (FLU A&B, COVID) ARPGX2 - Abnormal; Notable for the following  components:      Result Value   SARS Coronavirus 2 by RT PCR POSITIVE (*)    All other components within normal limits  CBC WITH DIFFERENTIAL/PLATELET - Abnormal; Notable for the following components:   WBC 14.6 (*)    Neutro Abs 12.1 (*)    All other components within normal limits  COMPREHENSIVE METABOLIC PANEL - Abnormal; Notable for the following components:   Total Protein 8.2 (*)    All other components within normal limits    EKG None  Radiology DG Chest 2 View  Result Date: 05/02/2021 CLINICAL DATA:  Cough EXAM: CHEST - 2 VIEW COMPARISON:  04/05/2021 FINDINGS: The heart size and mediastinal contours are within normal limits. Both lungs are clear. The visualized skeletal structures are unremarkable. IMPRESSION: No active cardiopulmonary disease. Electronically Signed   By: Helyn Numbers MD   On: 05/02/2021 02:42    Procedures Procedures   Medications Ordered in ED Medications - No data to display  ED Course  I have reviewed the triage vital signs and the nursing notes.  Pertinent labs & imaging results that were available during my care of the patient were reviewed by me and considered in my medical decision making (see chart for details).  Theodore Pierce was evaluated in Emergency Department on 05/02/2021 for the symptoms described in the history of present illness. He/she was evaluated in the context of the global COVID-19 pandemic, which necessitated consideration that the patient might be at risk for infection with the SARS-CoV-2 virus that causes COVID-19. Institutional protocols and algorithms that pertain to the evaluation of patients at risk for COVID-19 are in a state of rapid change based on information released by regulatory bodies including the CDC and federal and state organizations. These policies and algorithms were followed during the patient's care in the ED.    MDM Rules/Calculators/A&P                         Patient presents to the ED with  complaints of fever/cough.  Temp 99.5 orally, suspected tachycardia related. Vitals otherwise unremarkable. Exam is overall fairly benign.  Additional history obtained:  Additional history obtained from chart review & nursing note review.   Lab Tests:  I Ordered, reviewed, and interpreted labs, which included:  CBC: leukocytosis.  CMP: mild elevation in protein level.  COVID: Positive.  Imaging Studies ordered:  I ordered imaging studies which included CXR, I independently reviewed, formal radiology impression shows:  No active cardiopulmonary disease.   ED Course:  Suspect symptomatic from covid 19.  Labs overall reassuring.  Tachycardia improved in  the ED.  Ambulatory SpO2 98% on RA without signs of respiratory distress. Patient does not appear to require admission. We discussed options of COVID 19 tx including MAB infusion in the ED and possible oral therapy, risks/benefits discussed, patient declined. Will tx supportively. Discussed need for isolation. I discussed results, treatment plan, need for follow-up, and return precautions with the patient and his mother via telephone. Provided opportunity for questions, patient & his mother confirmed understanding and are in agreement with plan.   Portions of this note were generated with Scientist, clinical (histocompatibility and immunogenetics). Dictation errors may occur despite best attempts at proofreading.  Final Clinical Impression(s) / ED Diagnoses Final diagnoses:  COVID-19    Rx / DC Orders ED Discharge Orders    None       Cherly Anderson, PA-C 05/02/21 1443    Geoffery Lyons, MD 05/03/21 [phone]

## 2021-05-03 ENCOUNTER — Emergency Department (HOSPITAL_COMMUNITY): Payer: Medicaid Other

## 2021-05-03 ENCOUNTER — Encounter (HOSPITAL_COMMUNITY): Payer: Self-pay

## 2021-05-03 ENCOUNTER — Emergency Department (HOSPITAL_COMMUNITY)
Admission: EM | Admit: 2021-05-03 | Discharge: 2021-05-03 | Disposition: A | Discharge status: Home / Self Care | Payer: Medicaid Other | Attending: Emergency Medicine | Admitting: Emergency Medicine

## 2021-05-03 DIAGNOSIS — D72829 Elevated white blood cell count, unspecified: Secondary | ICD-10-CM | POA: Diagnosis not present

## 2021-05-03 DIAGNOSIS — E860 Dehydration: Secondary | ICD-10-CM | POA: Diagnosis not present

## 2021-05-03 DIAGNOSIS — Z28310 Unvaccinated for covid-19: Secondary | ICD-10-CM | POA: Insufficient documentation

## 2021-05-03 DIAGNOSIS — R000 Tachycardia, unspecified: Secondary | ICD-10-CM | POA: Diagnosis not present

## 2021-05-03 DIAGNOSIS — U071 COVID-19: Secondary | ICD-10-CM | POA: Diagnosis not present

## 2021-05-03 DIAGNOSIS — Z8616 Personal history of COVID-19: Secondary | ICD-10-CM | POA: Diagnosis not present

## 2021-05-03 DIAGNOSIS — R059 Cough, unspecified: Secondary | ICD-10-CM | POA: Diagnosis present

## 2021-05-03 MED ORDER — LACTATED RINGERS IV BOLUS
500.0000 mL | Freq: Once | INTRAVENOUS | Status: AC
  Administered 2021-05-03: 500 mL via INTRAVENOUS

## 2021-05-03 MED ORDER — IOHEXOL 350 MG/ML SOLN
80.0000 mL | Freq: Once | INTRAVENOUS | Status: AC | PRN
  Administered 2021-05-03: 80 mL via INTRAVENOUS

## 2021-05-03 MED ORDER — IBUPROFEN 200 MG PO TABS
400.0000 mg | ORAL_TABLET | Freq: Once | ORAL | Status: AC
  Administered 2021-05-03: 400 mg via ORAL
  Filled 2021-05-03: qty 2

## 2021-05-03 MED ORDER — HYDROCOD POLST-CPM POLST ER 10-8 MG/5ML PO SUER: 5.0000 mL | Freq: Every evening | ORAL | 0 refills | Status: DC | PRN

## 2021-05-03 MED ORDER — NIRMATRELVIR/RITONAVIR (PAXLOVID)TABLET: 3.0000 | ORAL_TABLET | Freq: Two times a day (BID) | ORAL | 0 refills | Status: AC

## 2021-05-03 MED ORDER — ACETAMINOPHEN 500 MG PO TABS
1000.0000 mg | ORAL_TABLET | Freq: Once | ORAL | Status: AC
  Administered 2021-05-03: 1000 mg via ORAL
  Filled 2021-05-03: qty 2

## 2021-05-03 MED ORDER — SODIUM CHLORIDE (PF) 0.9 % IJ SOLN
INTRAMUSCULAR | Status: AC
  Filled 2021-05-03: qty 50

## 2021-05-03 NOTE — ED Triage Notes (Addendum)
Patient arrived via [facility] from home  C/O Jitteriness after taking his albuterol breathing treatment.  C/O increase in coughing and fever. Last took tylenol last night. No fever currently with ems.  C/O ongoing shob since Sunday.   Tested positive for COVID-19 on Sunday  A/Ox4  Ambulatory with no assistance   Patient lives with mom and grandma   Denies chest pain at this time.    145/86 HR-143 98% RA 98.0 temp  RR-20

## 2021-05-03 NOTE — ED Notes (Signed)
Patient transported to CT 

## 2021-05-03 NOTE — ED Provider Notes (Signed)
[facility]-EMERGENCY DEPT Provider Note   CSN: [id number] Arrival date & time: 05/03/21  5093     History Chief Complaint  Patient presents with  . Covid Positive  . Cough    TIMOTHEY DAHLSTROM is a 20 y.o. male.  Patient is a 19 year old male with no significant medical problems who is presenting today with feeling jittery and short of breath.  Patient seen yesterday and did test COVID-positive.  CBC, CMP from yesterday were within normal limits except for a leukocytosis of 14.  Chest x-ray was clear yesterday.  Patient was given albuterol to see if that helped with his cough and congestion.  He reports after going home he did have a fever last night which he took Tylenol for.  However he continued to not feel well have cough and shortness of breath and tried albuterol this morning.  Shortly after using the albuterol he became very anxious and shaky.  He reports that it feels like his heart is racing and he still feels short of breath.  At home oxygen level was 92% which concerned him.  He has been eating and drinking and urinating every 6 hours.  He denies any vomiting or diarrhea.  Patient is unvaccinated but does report having COVID in 2020 and getting fairly sick from it at that time as well.  He does not have history of asthma and does not have a history of tobacco or marijuana use.  The history is provided by the patient and medical records.  Cough      History reviewed. No pertinent past medical history.  There are no problems to display for this patient.   History reviewed. No pertinent surgical history.     Family History  Problem Relation Age of Onset  . Diabetes Mother     Social History   Tobacco Use  . Smoking status: Never Smoker  . Smokeless tobacco: Never Used  Vaping Use  . Vaping Use: Never used  Substance Use Topics  . Alcohol use: Not Currently  . Drug use: Never    Home Medications Prior to Admission medications    Medication Sig Start Date End Date Taking? Authorizing Provider  azithromycin (ZITHROMAX) 250 MG tablet Take 1 tablet (250 mg total) by mouth daily. Take first 2 tablets together, then 1 every day until finished. 04/15/21   Mardella Layman, MD  benzonatate (TESSALON) 100 MG capsule Take 1 capsule (100 mg total) by mouth every 8 (eight) hours. 04/05/21   Merrilee Jansky, MD  chlorpheniramine-HYDROcodone (TUSSIONEX PENNKINETIC ER) 10-8 MG/5ML SUER Take 5 mLs by mouth at bedtime as needed for cough. 04/15/21   Mardella Layman, MD  naproxen (NAPROSYN) 500 MG tablet Take 1 tablet (500 mg total) by mouth 2 (two) times daily. 10/06/20   Hall-Potvin, [place], PA-C  predniSONE (DELTASONE) 20 MG tablet Take 2 tablets (40 mg total) by mouth daily. 04/15/21   Mardella Layman, MD    Allergies    Patient has no known allergies.  Review of Systems   Review of Systems  Respiratory: Positive for cough.   All other systems reviewed and are negative.   Physical Exam Updated Vital Signs BP (!) 121/104   Pulse (!) 147   Temp 99.9 F (37.7 C) (Oral)   Resp 18   SpO2 94%   Physical Exam Vitals and nursing note reviewed.  Constitutional:      General: He is not in acute distress.    Appearance: He is well-developed.  HENT:     Head: Normocephalic and atraumatic.     Nose: Congestion present.     Mouth/Throat:     Mouth: Mucous membranes are moist.  Eyes:     Conjunctiva/sclera: Conjunctivae normal.     Pupils: Pupils are equal, round, and reactive to light.  Cardiovascular:     Rate and Rhythm: Regular rhythm. Tachycardia present.     Pulses: Normal pulses.     Heart sounds: No murmur heard.   Pulmonary:     Effort: Pulmonary effort is normal. No respiratory distress.     Breath sounds: Normal breath sounds. No wheezing or rales.  Chest:     Chest wall: No tenderness.  Abdominal:     General: There is no distension.     Palpations: Abdomen is soft.     Tenderness: There is no abdominal  tenderness. There is no guarding or rebound.  Musculoskeletal:        General: No tenderness. Normal range of motion.     Cervical back: Normal range of motion and neck supple.     Right lower leg: No edema.     Left lower leg: No edema.  Skin:    General: Skin is warm and dry.     Findings: No erythema or rash.  Neurological:     Mental Status: He is alert and oriented to person, place, and time. Mental status is at baseline.  Psychiatric:        Mood and Affect: Mood normal.        Behavior: Behavior normal.     ED Results / Procedures / Treatments   Labs (all labs ordered are listed, but only abnormal results are displayed) Labs Reviewed - No data to display  EKG EKG Interpretation  Date/Time:  Wednesday May 03 2021 08:00:40 EDT Ventricular Rate:  135 PR Interval:  133 QRS Duration: 89 QT Interval:  272 QTC Calculation: 408 R Axis:   99 Text Interpretation: Sinus tachycardia Consider right ventricular hypertrophy No previous tracing Confirmed by Gwyneth Sprout ([id number]) on 05/03/2021 8:18:50 AM   Radiology DG Chest 2 View  Result Date: 05/02/2021 CLINICAL DATA:  Cough EXAM: CHEST - 2 VIEW COMPARISON:  04/05/2021 FINDINGS: The heart size and mediastinal contours are within normal limits. Both lungs are clear. The visualized skeletal structures are unremarkable. IMPRESSION: No active cardiopulmonary disease. Electronically Signed   By: Helyn Numbers MD   On: 05/02/2021 02:42   CT Angio Chest PE W and/or Wo Contrast  Result Date: 05/03/2021 CLINICAL DATA:  Shortness of breath. COVID positive. High probability of pulmonary embolism. EXAM: CT ANGIOGRAPHY CHEST WITH CONTRAST TECHNIQUE: Multidetector CT imaging of the chest was performed using the standard protocol during bolus administration of intravenous contrast. Multiplanar CT image reconstructions and MIPs were obtained to evaluate the vascular anatomy. CONTRAST:  54mL OMNIPAQUE IOHEXOL 350 MG/ML SOLN COMPARISON:  Chest  radiograph-earlier same day FINDINGS: Vascular Findings: There is suboptimal opacification of the pulmonary arterial system with the main pulmonary artery measuring only 198 Hounsfield units. Examination is further degraded secondary to patient respiratory artifact. Given this limitation, there are no discrete filling defects within the pulmonary arterial tree to the level of the bilateral segmental and subsegmental pulmonary arteries to suggest pulmonary embolism. Normal caliber of the main pulmonary artery. Borderline cardiomegaly.  No pericardial effusion. No evidence of thoracic aortic aneurysm or dissection on this non gated examination. Bovine configuration of the aortic arch. The branch vessels of the aortic arch appear widely  patent throughout their imaged courses. Review of the MIP images confirms the above findings. ---------------------------------------------------------------------------------- Nonvascular Findings: Mediastinum/Lymph Nodes: There is a minimal amount of ill-defined soft tissue within the anterior mediastinum favored to represent residual thymic tissue. No associated mass effect. No bulky mediastinal, hilar or axillary lymphadenopathy. Lungs/Pleura: Evaluation the pulmonary parenchyma is minimally degraded secondary to patient respiratory artifact. No discrete focal airspace opacities. No pleural effusion or pneumothorax. The central pulmonary airways appear widely patent. No discrete pulmonary nodules given limitation of the examination. Upper abdomen: Limited visualization of the upper abdomen is unremarkable. Musculoskeletal: No acute or aggressive osseous abnormalities. Several Schmorl's nodes are seen within the superior and inferior endplates of several midthoracic vertebral bodies. Regional soft tissues appear normal. Normal appearance of the thyroid gland. IMPRESSION: No acute cardiopulmonary disease. Specifically, no evidence of pulmonary embolism to the level of the bilateral  segmental and subsegmental pulmonary arteries. No discrete focal airspace opacities to suggest pneumonia. Electronically Signed   By: Simonne Come M.D.   On: 05/03/2021 14:22   DG Chest Port 1 View  Result Date: 05/03/2021 CLINICAL DATA:  Shortness of breath.  COVID positive. EXAM: PORTABLE CHEST 1 VIEW COMPARISON:  05/02/2021; 04/05/2021 FINDINGS: Grossly unchanged cardiac silhouette and mediastinal contours. No focal parenchymal opacities. No pleural effusion or pneumothorax. No evidence of edema. No acute osseous abnormalities. IMPRESSION: No acute cardiopulmonary disease. Specifically, no evidence of pneumonia. Electronically Signed   By: Simonne Come M.D.   On: 05/03/2021 08:16    Procedures Procedures   Medications Ordered in ED Medications  acetaminophen (TYLENOL) tablet 1,000 mg (has no administration in time range)  lactated ringers bolus 500 mL (has no administration in time range)    ED Course  I have reviewed the triage vital signs and the nursing notes.  Pertinent labs & imaging results that were available during my care of the patient were reviewed by me and considered in my medical decision making (see chart for details).    MDM Rules/Calculators/A&P                          20 year old male presenting today history of being known COVID-positive now day #5 of illness.  Patient seen yesterday with reassuring labs and x-ray.  He was given albuterol to use at home as needed.  Patient has continually felt short of breath with ongoing dry cough.  No nausea or vomiting.  He used albuterol today and felt very jittery and worse after using it.  Upon arrival here patient heart rate between 140-150 sinus rhythm.  Oxygen saturation between 93 to 94% on room air patient is also febrile at 100.  Suspect between elevating temperature and albuterol most likely the explanation for his tachycardia however we will get an EKG to ensure no evidence to suggest pericarditis.  Low suspicion for PE,  pericardial effusion or tamponade.  Will give 500 mL bolus of fluid, Tylenol and repeat chest x-ray.  Will observe patient to ensure improvement in vital signs.  9:51 AM Patient's heart rate has improved to the 120s but he is still febrile at 100.6 despite Tylenol.  X-ray is still clear.  EKG was sinus tachycardia but no findings concerning for pericarditis.  Will give patient a second 500 mL bolus and ibuprofen for fever.  We will continue to monitor.  Oxygen saturation remains 92% and greater.  12:57 PM Pt HR improved to 115 at the best but now is back to 130 and  O2 sats of 92%.  Will get CTA as pt's sx have not significantly improved.  2:43 PM CTA neg for acute findings.  Minutes of pericardial effusion, and no PEs and no pneumonia.  On repeat evaluation findings discussed with the patient.  Heart rate now 110.  He is eating and drinking and well-appearing at this time.  Do not feel that patient needs to be hospitalized.  Sats have remained stable.  Will give patient a prescription for Paxil COVID and he was given cough medication.  Informed to not use any further albuterol.  MDM Number of Diagnoses or Management Options   Amount and/or Complexity of Data Reviewed Tests in the radiology section of CPT: ordered and reviewed Tests in the medicine section of CPT: ordered and reviewed Independent visualization of images, tracings, or specimens: yes  Risk of Complications, Morbidity, and/or Mortality Presenting problems: high Diagnostic procedures: moderate Management options: moderate  Patient Progress Patient progress: improved   Final Clinical Impression(s) / ED Diagnoses Final diagnoses:  COVID  Dehydration  Sinus tachycardia    Rx / DC Orders ED Discharge Orders         Ordered    chlorpheniramine-HYDROcodone (TUSSIONEX PENNKINETIC ER) 10-8 MG/5ML SUER  At bedtime PRN        05/03/21 1442    nirmatrelvir/ritonavir EUA (PAXLOVID) TABS  2 times daily        05/03/21 1442            Gwyneth SproutPlunkett, Yailin Biederman, MD 05/03/21 1445

## 2021-05-03 NOTE — Discharge Instructions (Signed)
You can use tylenol every 4 hour for fever and ibuprofen every 6 hours as needed for fever.  Use the cough medication as needed and you were given a prescriptions for COVID medication.  Continue to stay hydrated.  Avoid using any further albuterol.  Return if symptoms worsen.

## 2021-10-31 ENCOUNTER — Other Ambulatory Visit: Payer: Self-pay

## 2021-10-31 ENCOUNTER — Ambulatory Visit
Admission: EM | Admit: 2021-10-31 | Discharge: 2021-10-31 | Disposition: A | Discharge status: Home / Self Care | Payer: Medicaid Other | Attending: Physician Assistant | Admitting: Physician Assistant

## 2021-10-31 DIAGNOSIS — J101 Influenza due to other identified influenza virus with other respiratory manifestations: Secondary | ICD-10-CM | POA: Diagnosis not present

## 2021-10-31 LAB — POCT INFLUENZA A/B
Influenza A, POC: POSITIVE — AB
Influenza B, POC: POSITIVE — AB

## 2021-10-31 MED ORDER — OSELTAMIVIR PHOSPHATE 75 MG PO CAPS: 75.0000 mg | ORAL_CAPSULE | Freq: Two times a day (BID) | ORAL | 0 refills | Status: AC

## 2021-10-31 MED ORDER — ACETAMINOPHEN 325 MG PO TABS
650.0000 mg | ORAL_TABLET | Freq: Once | ORAL | Status: AC
  Administered 2021-10-31: 650 mg via ORAL

## 2021-10-31 NOTE — ED Provider Notes (Signed)
[facility]    CSN: [id number] Arrival date & time: 10/31/21  1133      History   Chief Complaint Chief Complaint  Patient presents with   Headache   Generalized Body Aches    HPI Theodore Pierce is a 20 y.o. male.   Patient here today for evaluation of nasal congestion and drainage, cough, body aches, and headache that started yesterday.  He has tried over-the-counter medication with mild relief.  He states he took an at home COVID test and it was positive.  The history is provided by the patient.  Headache Associated symptoms: congestion, cough, fever and myalgias   Associated symptoms: no abdominal pain, no diarrhea, no ear pain, no nausea, no sore throat and no vomiting    History reviewed. No pertinent past medical history.  There are no problems to display for this patient.   History reviewed. No pertinent surgical history.     Home Medications    Prior to Admission medications   Medication Sig Start Date End Date Taking? Authorizing Provider  oseltamivir (TAMIFLU) 75 MG capsule Take 1 capsule (75 mg total) by mouth every 12 (twelve) hours. 10/31/21  Yes Tomi Bamberger, PA-C  azithromycin (ZITHROMAX) 250 MG tablet Take 1 tablet (250 mg total) by mouth daily. Take first 2 tablets together, then 1 every day until finished. Patient not taking: Reported on 05/03/2021 04/15/21   Mardella Layman, MD  benzonatate (TESSALON) 100 MG capsule Take 1 capsule (100 mg total) by mouth every 8 (eight) hours. Patient not taking: Reported on 05/03/2021 04/05/21   Merrilee Jansky, MD  chlorpheniramine-HYDROcodone [facility] PENNKINETIC ER) 10-8 MG/5ML SUER Take 5 mLs by mouth at bedtime as needed for cough. 05/03/21   Gwyneth Sprout, MD  predniSONE (DELTASONE) 20 MG tablet Take 2 tablets (40 mg total) by mouth daily. Patient not taking: Reported on 05/03/2021 04/15/21   Mardella Layman, MD    Family History Family History  Problem Relation Age of Onset   Diabetes  Mother     Social History Social History   Tobacco Use   Smoking status: Never   Smokeless tobacco: Never  Vaping Use   Vaping Use: Never used  Substance Use Topics   Alcohol use: Not Currently   Drug use: Never     Allergies   Patient has no known allergies.   Review of Systems Review of Systems  Constitutional:  Positive for chills and fever.  HENT:  Positive for congestion. Negative for ear pain and sore throat.   Eyes:  Negative for discharge and redness.  Respiratory:  Positive for cough. Negative for shortness of breath.   Gastrointestinal:  Negative for abdominal pain, diarrhea, nausea and vomiting.  Musculoskeletal:  Positive for myalgias.  Neurological:  Positive for headaches.    Physical Exam Triage Vital Signs ED Triage Vitals  Enc Vitals Group     BP 10/31/21 1158 104/60     Pulse Rate 10/31/21 1158 (!) 119     Resp 10/31/21 1158 18     Temp 10/31/21 1158 (!) 101.6 F (38.7 C)     Temp Source 10/31/21 1158 Oral     SpO2 10/31/21 1158 97 %     Weight --      Height --      Head Circumference --      Peak Flow --      Pain Score 10/31/21 1202 6     Pain Loc --  Pain Edu? --      Excl. in GC? --    No data found.  Updated Vital Signs BP 104/60 (BP Location: Left Arm)   Pulse (!) 119   Temp (!) 101.6 F (38.7 C) (Oral)   Resp 18   SpO2 97%      Physical Exam Vitals and nursing note reviewed.  Constitutional:      General: He is not in acute distress.    Appearance: Normal appearance. He is not ill-appearing.  HENT:     Head: Normocephalic and atraumatic.     Nose: Congestion present.  Eyes:     Conjunctiva/sclera: Conjunctivae normal.  Cardiovascular:     Rate and Rhythm: Normal rate and regular rhythm.     Heart sounds: Normal heart sounds. No murmur heard. Pulmonary:     Effort: Pulmonary effort is normal. No respiratory distress.     Breath sounds: Normal breath sounds. No wheezing, rhonchi or rales.  Skin:    General:  Skin is warm and dry.  Neurological:     Mental Status: He is alert.  Psychiatric:        Mood and Affect: Mood normal.        Thought Content: Thought content normal.     UC Treatments / Results  Labs (all labs ordered are listed, but only abnormal results are displayed) Labs Reviewed  POCT INFLUENZA A/B - Abnormal; Notable for the following components:      Result Value   Influenza A, POC Positive (*)    Influenza B, POC Positive (*)    All other components within normal limits  NOVEL CORONAVIRUS, NAA    EKG   Radiology No results found.  Procedures Procedures (including critical care time)  Medications Ordered in UC Medications  acetaminophen (TYLENOL) tablet 650 mg (650 mg Oral Given 10/31/21 1216)    Initial Impression / Assessment and Plan / UC Course  I have reviewed the triage vital signs and the nursing notes.  Pertinent labs & imaging results that were available during my care of the patient were reviewed by me and considered in my medical decision making (see chart for details).  Flu test positive, will treat with Tamiflu and recommended symptomatic treatment.  Will screen for COVID as well given reported positive at home COVID test and patient requests COVID screening.  Encouraged follow-up if symptoms fail to improve or worsen.  Final Clinical Impressions(s) / UC Diagnoses   Final diagnoses:  Influenza A   Discharge Instructions   None    ED Prescriptions     Medication Sig Dispense Auth. Provider   oseltamivir (TAMIFLU) 75 MG capsule Take 1 capsule (75 mg total) by mouth every 12 (twelve) hours. 10 capsule Tomi Bamberger, PA-C      PDMP not reviewed this encounter.   Tomi Bamberger, PA-C 10/31/21 1238

## 2021-10-31 NOTE — ED Triage Notes (Signed)
Onset yesterday afternoon of cough, congestion and increased sneezing. Later that night, he complains of "burning" HA as if his "head was on fire". Onset today of body aches. No meds taken. Denies abdominal pain and emesis. Pt is unsure about diarrhea.

## 2021-11-01 ENCOUNTER — Telehealth (HOSPITAL_COMMUNITY): Payer: Self-pay | Admitting: Emergency Medicine

## 2021-11-01 LAB — SARS-COV-2, NAA 2 DAY TAT

## 2021-11-01 LAB — NOVEL CORONAVIRUS, NAA: SARS-CoV-2, NAA: DETECTED — AB

## 2021-11-01 MED ORDER — PROMETHAZINE-DM 6.25-15 MG/5ML PO SYRP: 5.0000 mL | ORAL_SOLUTION | Freq: Four times a day (QID) | ORAL | 0 refills | Status: DC | PRN

## 2021-11-01 MED ORDER — ONDANSETRON HCL 4 MG PO TABS: 4.0000 mg | ORAL_TABLET | Freq: Three times a day (TID) | ORAL | 0 refills | Status: AC | PRN

## 2022-02-04 ENCOUNTER — Encounter: Payer: Self-pay | Admitting: Emergency Medicine

## 2022-02-04 ENCOUNTER — Other Ambulatory Visit: Payer: Self-pay

## 2022-02-04 ENCOUNTER — Ambulatory Visit
Admission: EM | Admit: 2022-02-04 | Discharge: 2022-02-04 | Disposition: A | Discharge status: Home / Self Care | Payer: Medicaid Other | Attending: Internal Medicine | Admitting: Internal Medicine

## 2022-02-04 DIAGNOSIS — J069 Acute upper respiratory infection, unspecified: Secondary | ICD-10-CM | POA: Diagnosis not present

## 2022-02-04 MED ORDER — PROMETHAZINE-DM 6.25-15 MG/5ML PO SYRP: 5.0000 mL | ORAL_SOLUTION | Freq: Four times a day (QID) | ORAL | 0 refills | Status: AC | PRN

## 2022-02-04 NOTE — ED Triage Notes (Signed)
Patient c/o productive cough, nasal drainage, low grade fever x 2 days.  Patient has been taken Mucinex and Tylenol.

## 2022-02-04 NOTE — ED Provider Notes (Signed)
[facility]    CSN: [id number] Arrival date & time: 02/04/22  1124      History   Chief Complaint Chief Complaint  Patient presents with   Cough    HPI Theodore Pierce is a 21 y.o. male.   Patient presents with cough and nasal drainage that has been present for approximately 2 days.  He reports that he had a fever this morning of 99.  He has had a known sick contact with similar symptoms.  Patient has taken Mucinex and Tylenol with minimal improvement.  Denies chest pain, shortness of breath, sore throat, ear pain, nausea, vomiting, diarrhea, abdominal pain.   Cough  History reviewed. No pertinent past medical history.  There are no problems to display for this patient.   History reviewed. No pertinent surgical history.     Home Medications    Prior to Admission medications   Medication Sig Start Date End Date Taking? Authorizing Provider  promethazine-dextromethorphan (PROMETHAZINE-DM) 6.25-15 MG/5ML syrup Take 5 mLs by mouth 4 (four) times daily as needed for cough. 02/04/22  Yes Nevin Kozuch, Rolly Salter E, FNP  azithromycin (ZITHROMAX) 250 MG tablet Take 1 tablet (250 mg total) by mouth daily. Take first 2 tablets together, then 1 every day until finished. Patient not taking: Reported on 05/03/2021 04/15/21   Mardella Layman, MD  benzonatate (TESSALON) 100 MG capsule Take 1 capsule (100 mg total) by mouth every 8 (eight) hours. Patient not taking: Reported on 05/03/2021 04/05/21   Merrilee Jansky, MD  ondansetron (ZOFRAN) 4 MG tablet Take 1 tablet (4 mg total) by mouth every 8 (eight) hours as needed for nausea or vomiting. 11/01/21   LampteyBritta Mccreedy, MD  oseltamivir (TAMIFLU) 75 MG capsule Take 1 capsule (75 mg total) by mouth every 12 (twelve) hours. 10/31/21   Tomi Bamberger, PA-C  predniSONE (DELTASONE) 20 MG tablet Take 2 tablets (40 mg total) by mouth daily. Patient not taking: Reported on 05/03/2021 04/15/21   Mardella Layman, MD    Family History Family  History  Problem Relation Age of Onset   Diabetes Mother     Social History Social History   Tobacco Use   Smoking status: Never   Smokeless tobacco: Never  Vaping Use   Vaping Use: Never used  Substance Use Topics   Alcohol use: Not Currently   Drug use: Never     Allergies   Patient has no known allergies.   Review of Systems Review of Systems Per HPI  Physical Exam Triage Vital Signs ED Triage Vitals [02/04/22 1131]  Enc Vitals Group     BP 134/79     Pulse Rate 94     Resp 18     Temp 98.9 F (37.2 C)     Temp Source Oral     SpO2 96 %     Weight 179 lb 14.3 oz (81.6 kg)     Height 5\' 6"  (1.676 m)     Head Circumference      Peak Flow      Pain Score 0     Pain Loc      Pain Edu?      Excl. in GC?    No data found.  Updated Vital Signs BP 134/79 (BP Location: Left Arm)    Pulse 94    Temp 98.9 F (37.2 C) (Oral)    Resp 18    Ht 5\' 6"  (1.676 m)    Wt 179 lb 14.3 oz (81.6  kg)    SpO2 96%    BMI 29.04 kg/m   Visual Acuity Right Eye Distance:   Left Eye Distance:   Bilateral Distance:    Right Eye Near:   Left Eye Near:    Bilateral Near:     Physical Exam Constitutional:      General: He is not in acute distress.    Appearance: Normal appearance. He is not toxic-appearing or diaphoretic.  HENT:     Head: Normocephalic and atraumatic.     Right Ear: Tympanic membrane and ear canal normal.     Left Ear: Tympanic membrane and ear canal normal.     Nose: Rhinorrhea present. Rhinorrhea is clear.     Mouth/Throat:     Mouth: Mucous membranes are moist.     Pharynx: Posterior oropharyngeal erythema present.  Eyes:     Extraocular Movements: Extraocular movements intact.     Conjunctiva/sclera: Conjunctivae normal.     Pupils: Pupils are equal, round, and reactive to light.  Cardiovascular:     Rate and Rhythm: Normal rate and regular rhythm.     Pulses: Normal pulses.     Heart sounds: Normal heart sounds.  Pulmonary:     Effort:  Pulmonary effort is normal. No respiratory distress.     Breath sounds: Normal breath sounds. No wheezing.  Abdominal:     General: Abdomen is flat. Bowel sounds are normal.     Palpations: Abdomen is soft.  Musculoskeletal:        General: Normal range of motion.     Cervical back: Normal range of motion.  Skin:    General: Skin is warm and dry.  Neurological:     General: No focal deficit present.     Mental Status: He is alert and oriented to person, place, and time. Mental status is at baseline.  Psychiatric:        Mood and Affect: Mood normal.        Behavior: Behavior normal.     UC Treatments / Results  Labs (all labs ordered are listed, but only abnormal results are displayed) Labs Reviewed  COVID-19, FLU A+B NAA    EKG   Radiology No results found.  Procedures Procedures (including critical care time)  Medications Ordered in UC Medications - No data to display  Initial Impression / Assessment and Plan / UC Course  I have reviewed the triage vital signs and the nursing notes.  Pertinent labs & imaging results that were available during my care of the patient were reviewed by me and considered in my medical decision making (see chart for details).     Patient presents with symptoms likely from a viral upper respiratory infection. Differential includes bacterial pneumonia, sinusitis, allergic rhinitis, COVID-19,. Do not suspect underlying cardiopulmonary process. Symptoms seem unlikely related to ACS, CHF or COPD exacerbations, pneumonia, pneumothorax. Patient is nontoxic appearing and not in need of emergent medical intervention.  No suspicion for strep throat.  COVID and flu test pending.  Recommended symptom control with over the counter medications: Daily oral anti-histamine, Oral decongestant or IN corticosteroid, saline irrigations, cepacol lozenges, Robitussin, Delsym, honey tea.  Patient sent cough medication and advised that it can make him  drowsy.  Return if symptoms fail to improve in 1-2 weeks or you develop shortness of breath, chest pain, severe headache. Patient states understanding and is agreeable.  Discharged with PCP followup.  Final Clinical Impressions(s) / UC Diagnoses   Final diagnoses:  Viral upper respiratory  tract infection with cough     Discharge Instructions      It appears that you have a viral upper respiratory infection that should self resolve in the next few days.  A cough medication has been prescribed to help alleviate symptoms.  This cough medication can make you drowsy.    ED Prescriptions     Medication Sig Dispense Auth. Provider   promethazine-dextromethorphan (PROMETHAZINE-DM) 6.25-15 MG/5ML syrup Take 5 mLs by mouth 4 (four) times daily as needed for cough. 118 mL Gustavus Bryant, [place]      PDMP not reviewed this encounter.   Gustavus Bryant, [place] 02/04/22 1139

## 2022-02-04 NOTE — Discharge Instructions (Signed)
It appears that you have a viral upper respiratory infection that should self resolve in the next few days.  A cough medication has been prescribed to help alleviate symptoms.  This cough medication can make you drowsy.

## 2022-02-05 LAB — COVID-19, FLU A+B NAA
Influenza A, NAA: NOT DETECTED
Influenza B, NAA: NOT DETECTED
SARS-CoV-2, NAA: NOT DETECTED

## 2022-04-06 ENCOUNTER — Ambulatory Visit
Admission: EM | Admit: 2022-04-06 | Discharge: 2022-04-06 | Disposition: A | Discharge status: Home / Self Care | Payer: Medicaid Other | Attending: Urgent Care | Admitting: Urgent Care

## 2022-04-06 DIAGNOSIS — R051 Acute cough: Secondary | ICD-10-CM

## 2022-04-06 DIAGNOSIS — R5383 Other fatigue: Secondary | ICD-10-CM | POA: Diagnosis not present

## 2022-04-06 DIAGNOSIS — B349 Viral infection, unspecified: Secondary | ICD-10-CM | POA: Diagnosis not present

## 2022-04-06 DIAGNOSIS — R509 Fever, unspecified: Secondary | ICD-10-CM | POA: Diagnosis not present

## 2022-04-06 NOTE — Discharge Instructions (Addendum)
Patient advised to continue Ibuprofen 600mg  every 8 hours for fever and body aches.  Patient advised to take Robitussin DM or Delsym for cough as needed.  Patient advised to drink fluids frequently.  Patient advised if symptoms increase then follow-up with PCP or return to UC. ?

## 2022-04-06 NOTE — ED Provider Notes (Signed)
?[facility] ? ? ? ?CSN: [id number] ?Arrival date & time: 04/06/22  1334 ? ? ?  ? ?History   ?Chief Complaint ?Chief Complaint  ?Patient presents with  ? body in oven  ? ? ?HPI ?Theodore Pierce is a 21 y.o. male.  ? ?Patient relates x12 hours history of sudden fever ranging around 99-100.  Sweats, body feels like it is on fire, chest congestion,cough with intermittent production clear. Patient relates that he has been taking Advil and Tylenol as needed for fever. Patient is having some mild fatigue associated but he denies ear pain. sore throat.  Patinet has runny nose but is tolerating fluids well.  Patient denies body aches and sob. ? ? ? ?History reviewed. No pertinent past medical history. ? ?There are no problems to display for this patient. ? ? ?History reviewed. No pertinent surgical history. ? ? ? ? ?Home Medications   ? ?Prior to Admission medications   ?Medication Sig Start Date End Date Taking? Authorizing Provider  ?azithromycin (ZITHROMAX) 250 MG tablet Take 1 tablet (250 mg total) by mouth daily. Take first 2 tablets together, then 1 every day until finished. ?Patient not taking: Reported on 05/03/2021 04/15/21   Mardella Layman, MD  ?benzonatate (TESSALON) 100 MG capsule Take 1 capsule (100 mg total) by mouth every 8 (eight) hours. ?Patient not taking: Reported on 05/03/2021 04/05/21   Merrilee Jansky, MD  ?ondansetron (ZOFRAN) 4 MG tablet Take 1 tablet (4 mg total) by mouth every 8 (eight) hours as needed for nausea or vomiting. 11/01/21   LampteyBritta Mccreedy, MD  ?oseltamivir (TAMIFLU) 75 MG capsule Take 1 capsule (75 mg total) by mouth every 12 (twelve) hours. 10/31/21   Tomi Bamberger, PA-C  ?predniSONE (DELTASONE) 20 MG tablet Take 2 tablets (40 mg total) by mouth daily. ?Patient not taking: Reported on 05/03/2021 04/15/21   Mardella Layman, MD  ?promethazine-dextromethorphan (PROMETHAZINE-DM) 6.25-15 MG/5ML syrup Take 5 mLs by mouth 4 (four) times daily as needed for cough. 02/04/22   Gustavus Bryant, FNP  ? ? ?Family History ?Family History  ?Problem Relation Age of Onset  ? Diabetes Mother   ? ? ?Social History ?Social History  ? ?Tobacco Use  ? Smoking status: Never  ? Smokeless tobacco: Never  ?Vaping Use  ? Vaping Use: Never used  ?Substance Use Topics  ? Alcohol use: Not Currently  ? Drug use: Never  ? ? ? ?Allergies   ?Patient has no known allergies. ? ? ?Review of Systems ?Review of Systems  ?Constitutional:  Positive for chills and fatigue.  ?HENT:  Positive for congestion and rhinorrhea.   ?Respiratory:  Positive for cough.   ? ? ?Physical Exam ?Triage Vital Signs ?ED Triage Vitals [04/06/22 1406]  ?Enc Vitals Group  ?   BP 103/72  ?   Pulse Rate (!) 115  ?   Resp 18  ?   Temp 99.4 ?F (37.4 ?C)  ?   Temp Source Oral  ?   SpO2 98 %  ?   Weight   ?   Height   ?   Head Circumference   ?   Peak Flow   ?   Pain Score 0  ?   Pain Loc   ?   Pain Edu?   ?   Excl. in GC?   ? ?No data found. ? ?Updated Vital Signs ?BP 103/72 (BP Location: Left Arm)   Pulse (!) 115   Temp 99.4 ?F (37.4 ?  C) (Oral)   Resp 18   SpO2 98%  ? ?Visual Acuity ?Right Eye Distance:   ?Left Eye Distance:   ?Bilateral Distance:   ? ?Right Eye Near:   ?Left Eye Near:    ?Bilateral Near:    ? ?Physical Exam ?Constitutional:   ?   Appearance: Normal appearance.  ?HENT:  ?   Right Ear: Tympanic membrane normal.  ?   Left Ear: Tympanic membrane normal.  ?   Mouth/Throat:  ?   Mouth: Mucous membranes are moist.  ?   Pharynx: Oropharynx is clear.  ?Cardiovascular:  ?   Rate and Rhythm: Normal rate and regular rhythm.  ?   Heart sounds: Normal heart sounds.  ?Pulmonary:  ?   Effort: Pulmonary effort is normal.  ?   Breath sounds: Rhonchi present.  ?Neurological:  ?   Mental Status: He is alert.  ? ? ? ?UC Treatments / Results  ?Labs ?(all labs ordered are listed, but only abnormal results are displayed) ?Labs Reviewed  ?COVID-19, FLU A+B NAA  ? ? ?EKG ? ? ?Radiology ?No results found. ? ?Procedures ?Procedures (including critical care  time) ? ?Medications Ordered in UC ?Medications - No data to display ? ?Initial Impression / Assessment and Plan / UC Course  ?I have reviewed the triage vital signs and the nursing notes. ? ?Pertinent labs & imaging results that were available during my care of the patient were reviewed by me and considered in my medical decision making (see chart for details). ? ? Patient advised to continue Ibuprofen 600mg  every 8 hours for fever and body aches.  Patient advised to take Robitussin DM or Delsym for cough as needed.  Patient advised to drink fluids frequently.  Patient advised if symptoms increase then follow-up with PCP or return to UC. ? ? ?Final Clinical Impressions(s) / UC Diagnoses  ? ?Final diagnoses:  ?Acute viral syndrome  ?Fever, unspecified  ?Acute cough  ?Chills with fever  ?Other fatigue  ? ? ? ?Discharge Instructions   ? ?  ?Patient advised to continue Ibuprofen 600mg  every 8 hours for fever and body aches.  Patient advised to take Robitussin DM or Delsym for cough as needed.  Patient advised to drink fluids frequently.  Patient advised if symptoms increase then follow-up with PCP or return to UC. ? ? ? ? ?ED Prescriptions   ?None ?  ? ?PDMP not reviewed this encounter. ?  ? , PA-C ?04/06/22 1552 ? ?

## 2022-04-06 NOTE — ED Triage Notes (Signed)
Pt c/o headache, body sweating, "I feel like I'm being put in an oven."  ? ?Denies cough, sore throat, nasal congestion, ear ache, nausea, vomiting, diarrhea, constipation, abd pain,  ? ?Onset today  ?

## 2022-04-07 LAB — COVID-19, FLU A+B NAA
Influenza A, NAA: NOT DETECTED
Influenza B, NAA: NOT DETECTED
SARS-CoV-2, NAA: NOT DETECTED

## 2022-10-12 IMAGING — CR DG CHEST 2V
2 series · 2 of 2 positions shown · non-contrast
Comparison: 04/05/2021

CLINICAL DATA: Cough

EXAM:
CHEST - 2 VIEW

[w chest pa]
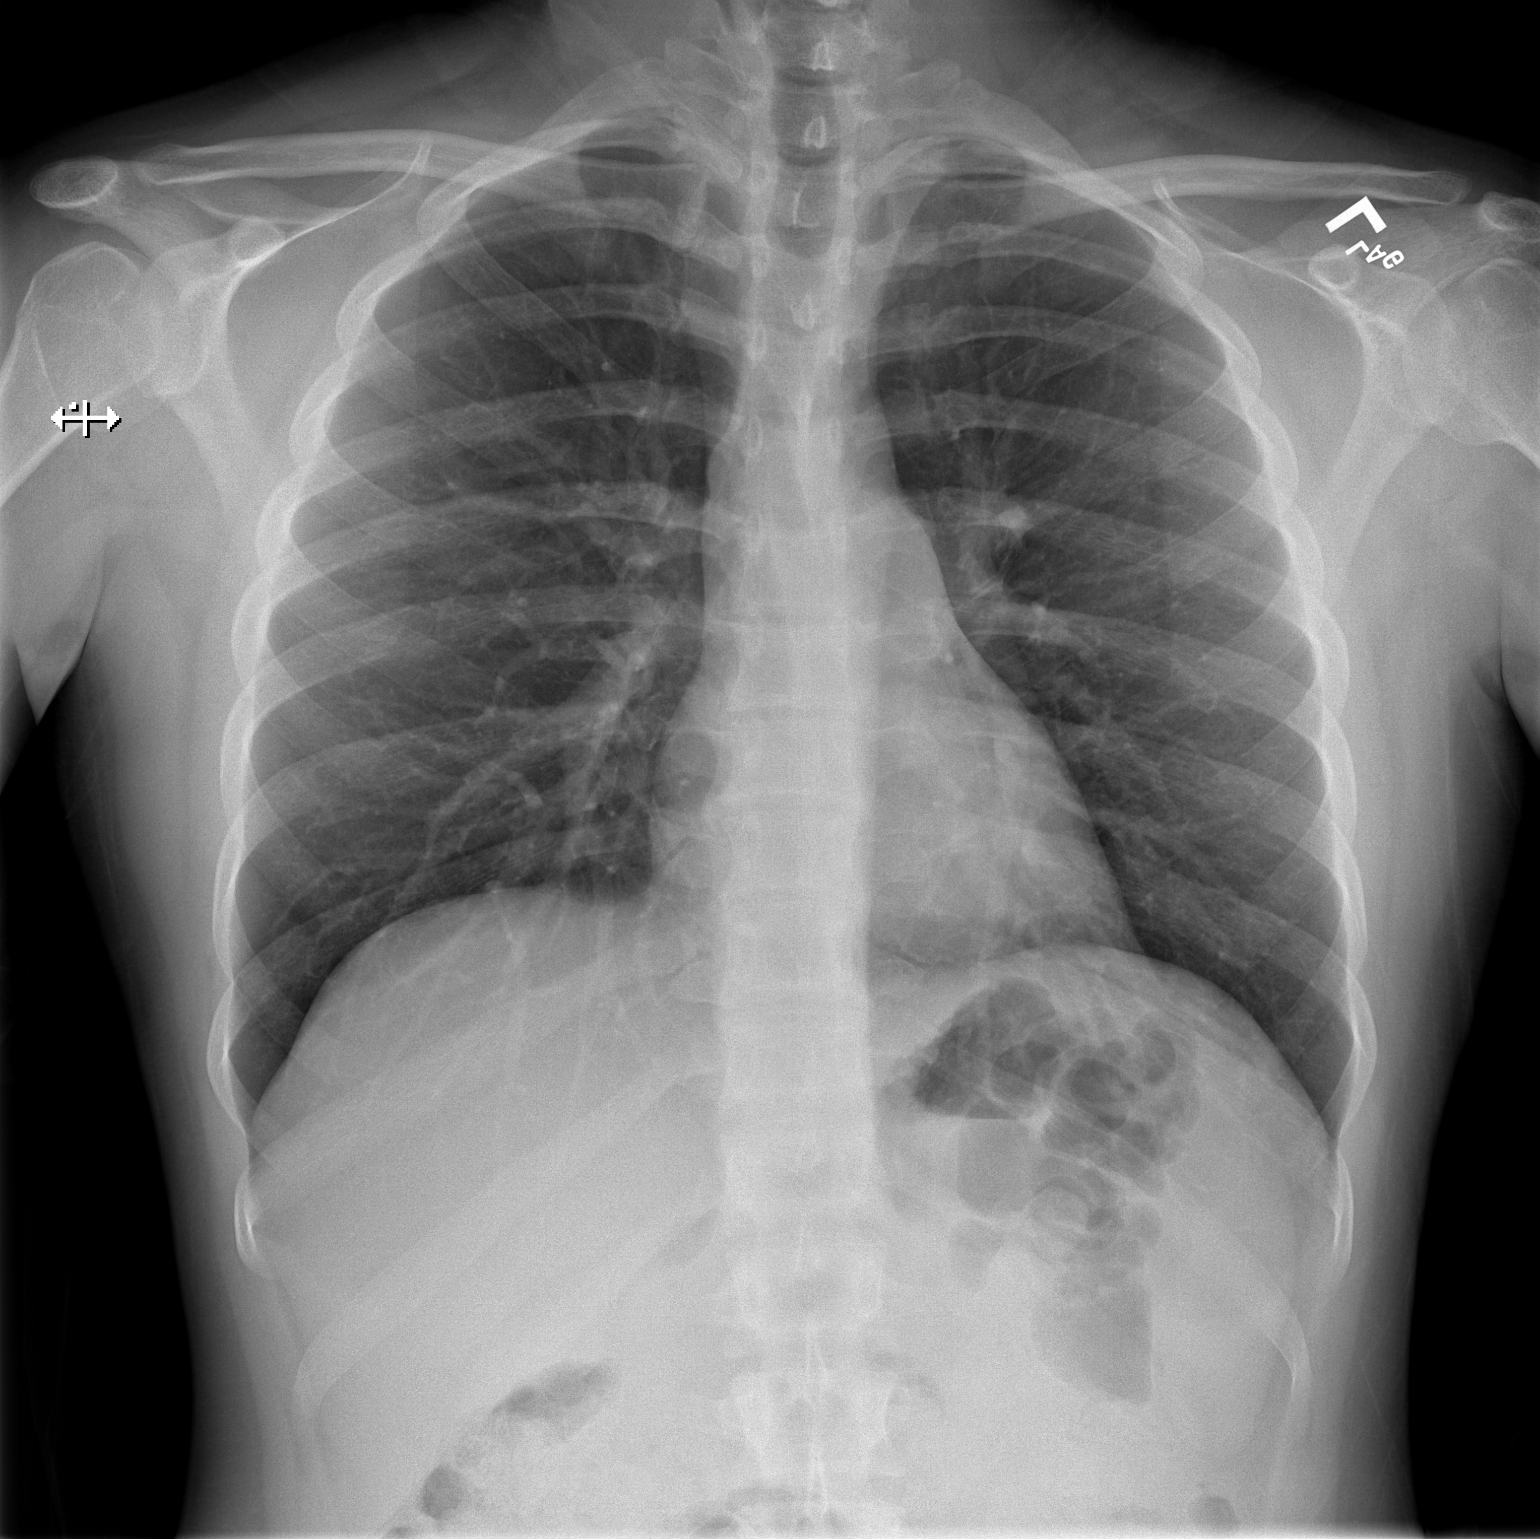

[w chest lat]
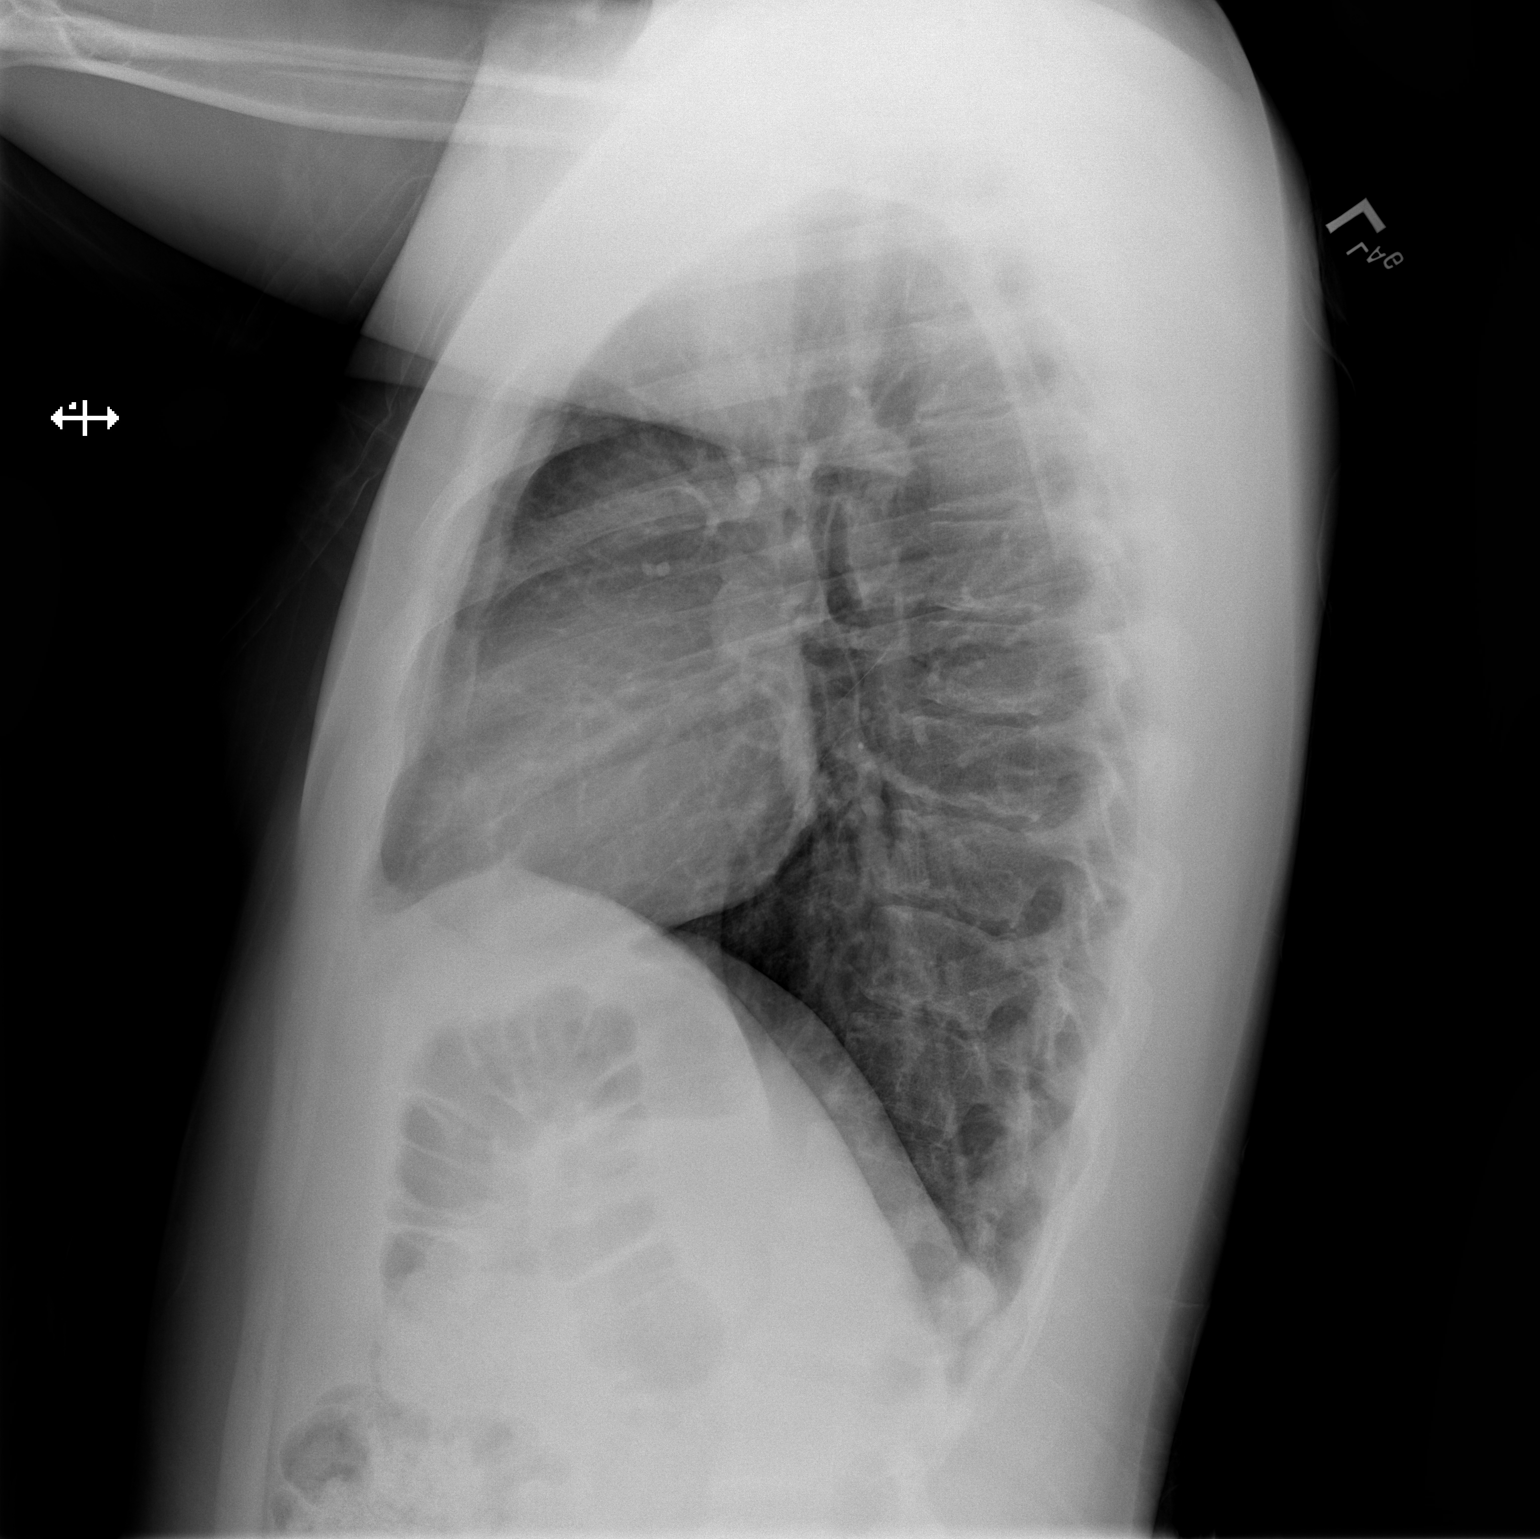

[2 of 2 positions shown; findings below may reference images not displayed]

FINDINGS: The heart size and mediastinal contours are within normal limits.
Both lungs are clear. The visualized skeletal structures are
unremarkable.
IMPRESSION: No active cardiopulmonary disease.
